# Patient Record
Sex: Male | Born: 1970 | Race: White | Hispanic: No | Marital: Married
Health system: Southern US, Community
[De-identification: ages and names within clinical notes are randomized; demographics above are authoritative.]

---

## 2021-12-01 ENCOUNTER — Other Ambulatory Visit (HOSPITAL_COMMUNITY): Payer: Self-pay

## 2021-12-01 ENCOUNTER — Inpatient Hospital Stay
Admission: RE | Admit: 2021-12-01 | Discharge: 2021-12-25 | Disposition: A | Discharge status: Rehabilitation Facility | Payer: No Typology Code available for payment source

## 2021-12-01 DIAGNOSIS — F10931 Alcohol use, unspecified with withdrawal delirium: Secondary | ICD-10-CM

## 2021-12-01 DIAGNOSIS — A419 Sepsis, unspecified organism: Secondary | ICD-10-CM

## 2021-12-01 DIAGNOSIS — N170 Acute kidney failure with tubular necrosis: Secondary | ICD-10-CM

## 2021-12-01 DIAGNOSIS — R6520 Severe sepsis without septic shock: Secondary | ICD-10-CM

## 2021-12-01 DIAGNOSIS — G061 Intraspinal abscess and granuloma: Secondary | ICD-10-CM

## 2021-12-01 DIAGNOSIS — J9621 Acute and chronic respiratory failure with hypoxia: Secondary | ICD-10-CM

## 2021-12-01 LAB — BLOOD GAS, ARTERIAL
Acid-Base Excess: 6.6 mmol/L — ABNORMAL HIGH (ref 0.0–2.0)
Bicarbonate: 31.3 mmol/L — ABNORMAL HIGH (ref 20.0–28.0)
O2 Saturation: 99.2 %
Patient temperature: 37
pCO2 arterial: 44 mmHg (ref 32–48)
pH, Arterial: 7.46 — ABNORMAL HIGH (ref 7.35–7.45)
pO2, Arterial: 132 mmHg — ABNORMAL HIGH (ref 83–108)

## 2021-12-01 IMAGING — DX DG ABDOMEN 1V
1 series · 1 of 1 positions shown · non-contrast
Comparison: [DATE]

CLINICAL DATA: Check PEG tube for leakage.

EXAM:
ABDOMEN - 1 VIEW

[abdomen]
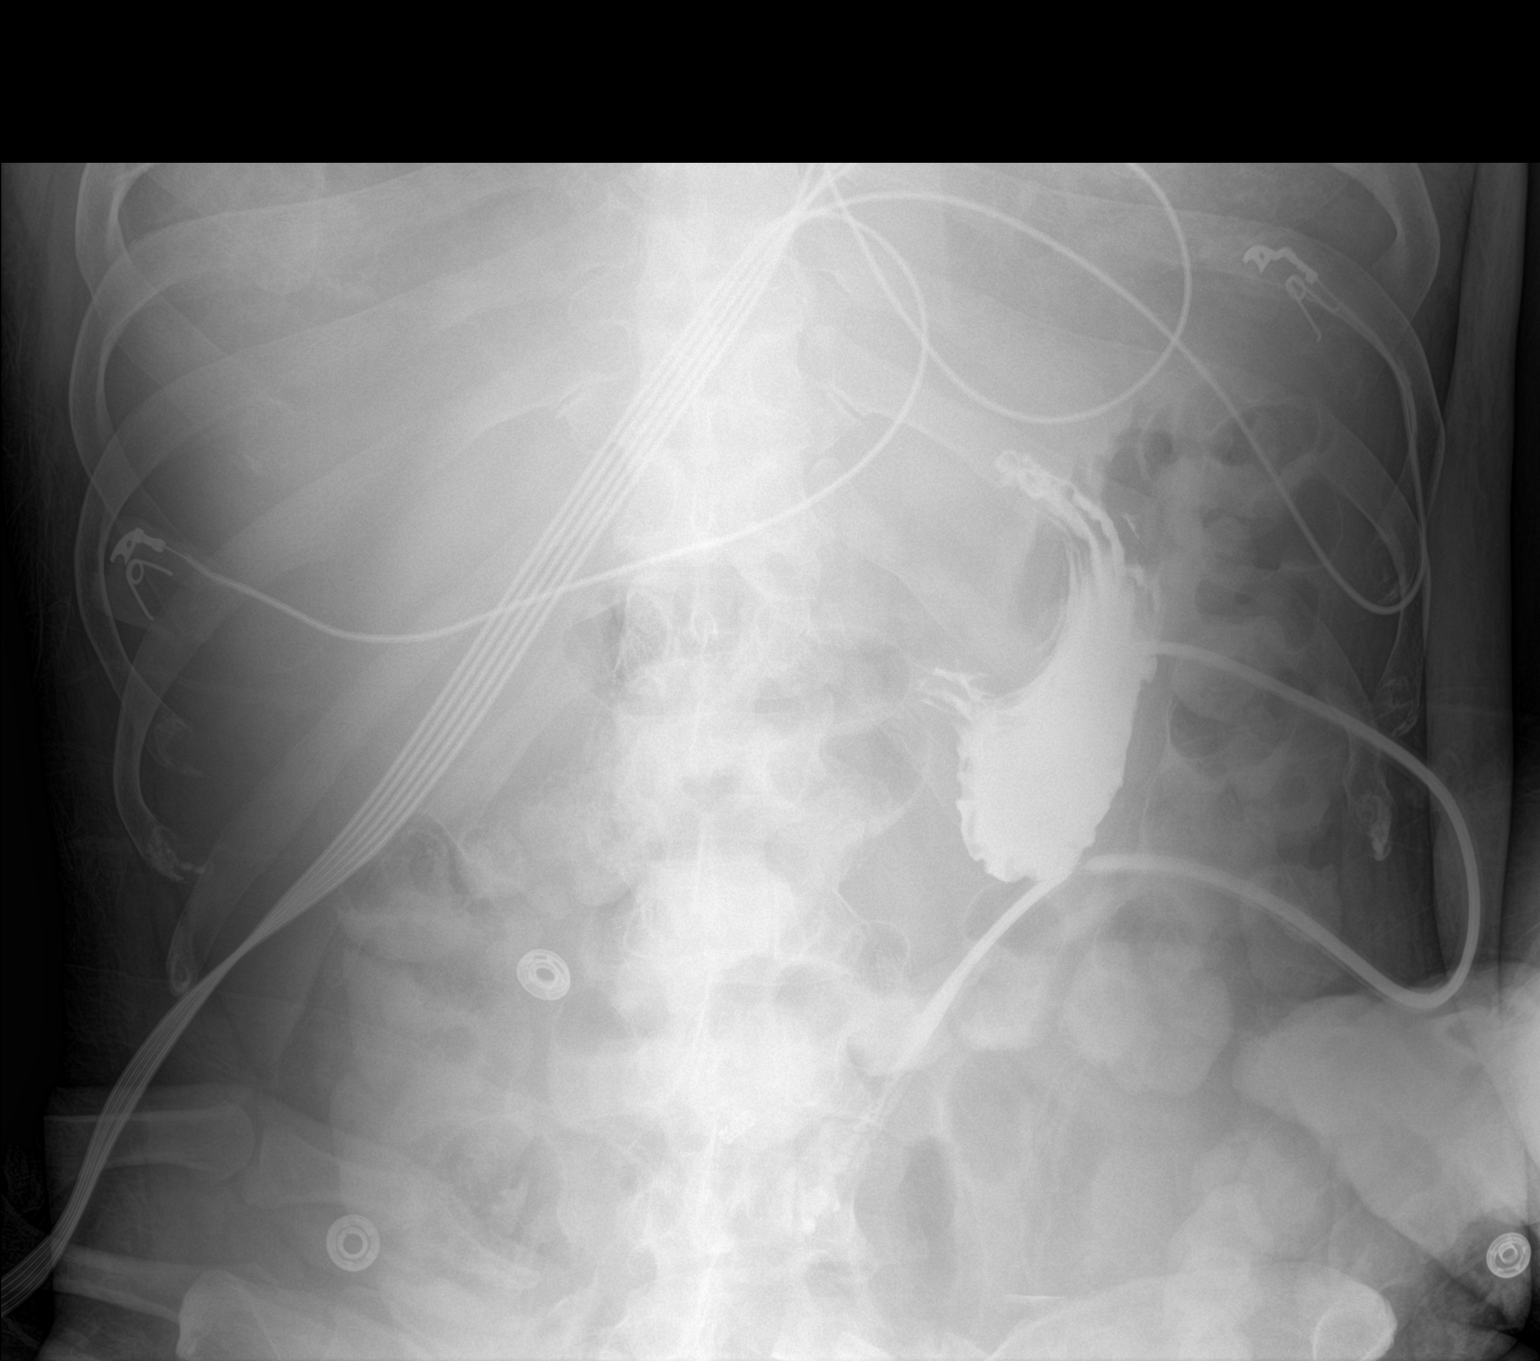

[1 of 1 positions shown; findings below may reference images not displayed]

FINDINGS: Contrast material has been instilled via the indwelling percutaneous
gastrostomy tube. Contrast material is demonstrated in the stomach.
No contrast extravasation. This suggest appropriate positioning of
the catheter without evidence of contrast leak.
IMPRESSION: Instilled contrast material is within the stomach without leakage
suggesting appropriate gastrostomy tube placement.

## 2021-12-01 IMAGING — DX DG CHEST 1V
1 series · 1 of 1 positions shown · non-contrast
Comparison: [DATE]

CLINICAL DATA: Respiratory failure.  Check PEG placement

EXAM:
CHEST  1 VIEW

[chest]
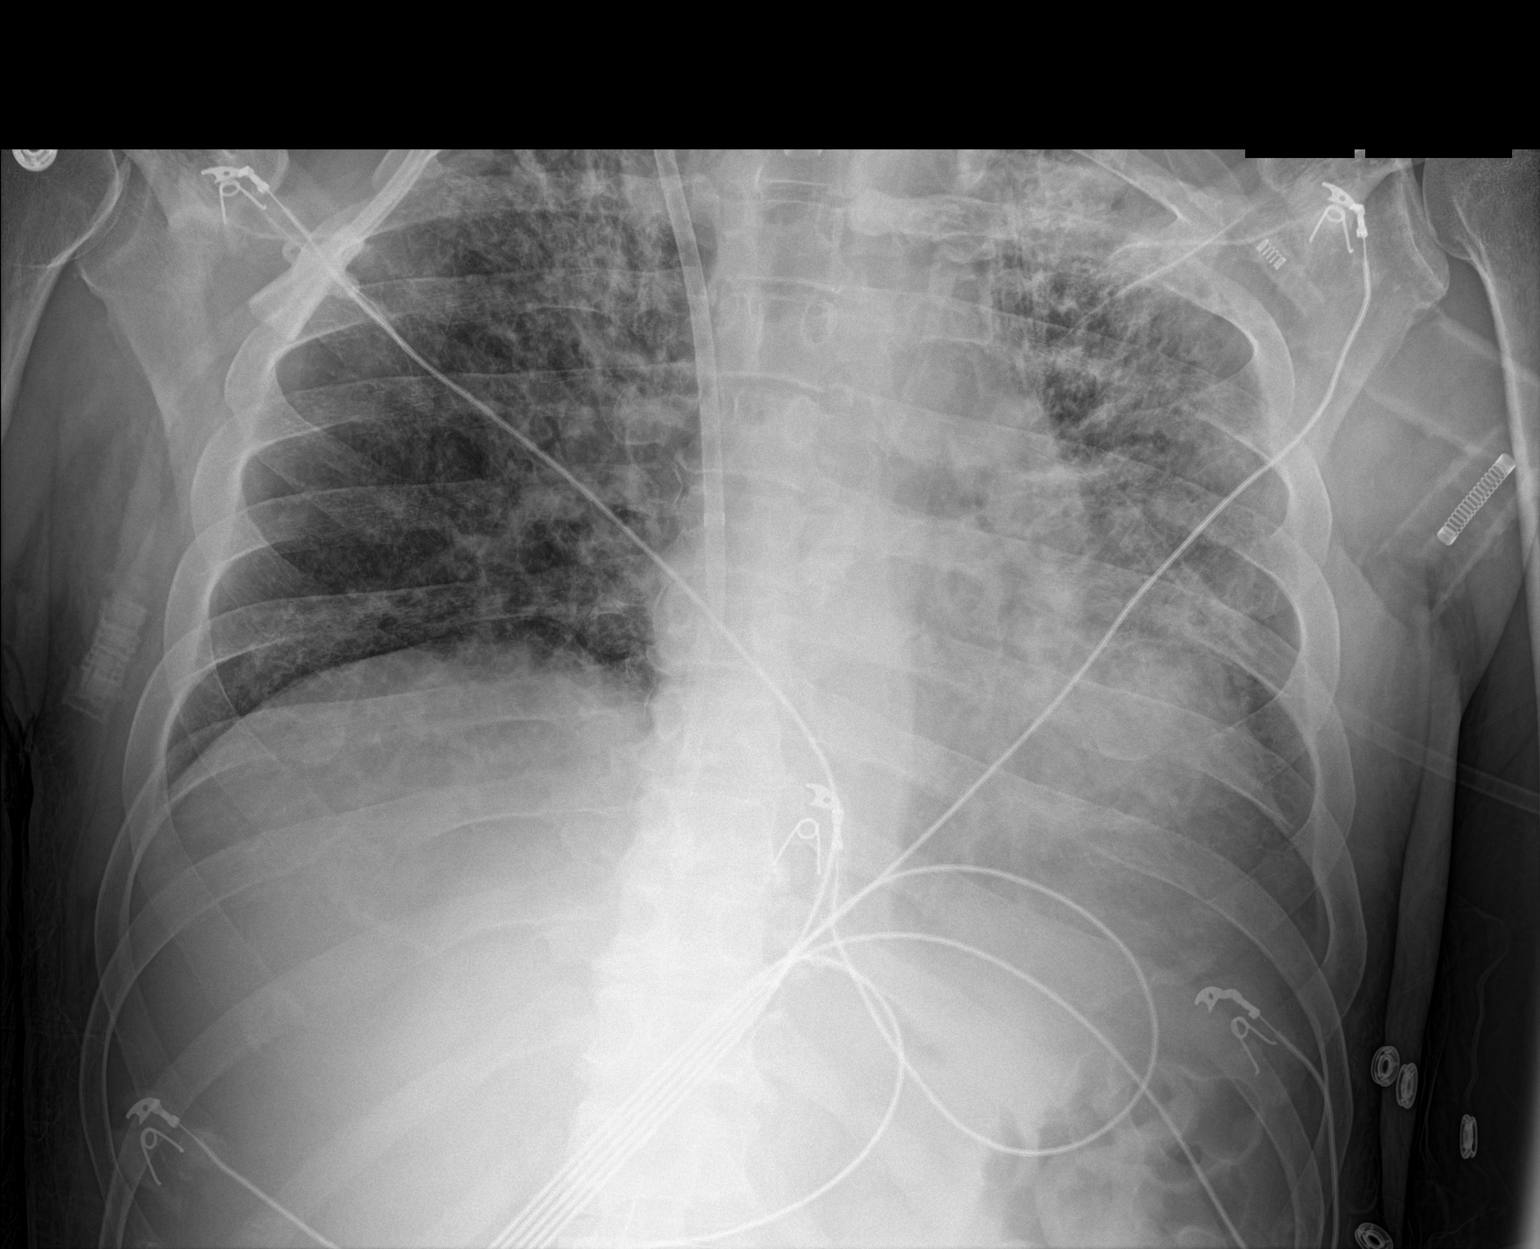

[1 of 1 positions shown; findings below may reference images not displayed]

FINDINGS: Tracheostomy tube and right central venous catheter remain in place
without change since prior study. Mild cardiac enlargement. Diffuse
bilateral pulmonary infiltrates are again demonstrated. No pleural
effusions. No pneumothorax.
IMPRESSION: Bilateral pulmonary infiltrates are unchanged. Appliances appear in
satisfactory position.

## 2021-12-01 MED ORDER — DIATRIZOATE MEGLUMINE & SODIUM 66-10 % PO SOLN
ORAL | Status: AC
  Filled 2021-12-01: qty 30

## 2021-12-01 MED ORDER — DIATRIZOATE MEGLUMINE & SODIUM 66-10 % PO SOLN
30.0000 mL | Freq: Once | ORAL | Status: AC
  Administered 2021-12-01: 30 mL

## 2021-12-02 ENCOUNTER — Other Ambulatory Visit (HOSPITAL_COMMUNITY): Payer: Self-pay

## 2021-12-02 ENCOUNTER — Other Ambulatory Visit (HOSPITAL_BASED_OUTPATIENT_CLINIC_OR_DEPARTMENT_OTHER): Payer: No Typology Code available for payment source

## 2021-12-02 DIAGNOSIS — F10931 Alcohol use, unspecified with withdrawal delirium: Secondary | ICD-10-CM

## 2021-12-02 DIAGNOSIS — I469 Cardiac arrest, cause unspecified: Secondary | ICD-10-CM

## 2021-12-02 DIAGNOSIS — J9621 Acute and chronic respiratory failure with hypoxia: Secondary | ICD-10-CM | POA: Diagnosis not present

## 2021-12-02 DIAGNOSIS — A419 Sepsis, unspecified organism: Secondary | ICD-10-CM

## 2021-12-02 DIAGNOSIS — G061 Intraspinal abscess and granuloma: Secondary | ICD-10-CM

## 2021-12-02 DIAGNOSIS — R6520 Severe sepsis without septic shock: Secondary | ICD-10-CM

## 2021-12-02 DIAGNOSIS — N170 Acute kidney failure with tubular necrosis: Secondary | ICD-10-CM

## 2021-12-02 LAB — CBC WITH DIFFERENTIAL/PLATELET
Abs Immature Granulocytes: 0.05 10*3/uL (ref 0.00–0.07)
Basophils Absolute: 0.1 10*3/uL (ref 0.0–0.1)
Basophils Relative: 0 %
Eosinophils Absolute: 0.8 10*3/uL — ABNORMAL HIGH (ref 0.0–0.5)
Eosinophils Relative: 7 %
HCT: 22 % — ABNORMAL LOW (ref 39.0–52.0)
Hemoglobin: 7.2 g/dL — ABNORMAL LOW (ref 13.0–17.0)
Immature Granulocytes: 0 %
Lymphocytes Relative: 8 %
Lymphs Abs: 0.9 10*3/uL (ref 0.7–4.0)
MCH: 30.5 pg (ref 26.0–34.0)
MCHC: 32.7 g/dL (ref 30.0–36.0)
MCV: 93.2 fL (ref 80.0–100.0)
Monocytes Absolute: 0.7 10*3/uL (ref 0.1–1.0)
Monocytes Relative: 6 %
Neutro Abs: 8.7 10*3/uL — ABNORMAL HIGH (ref 1.7–7.7)
Neutrophils Relative %: 79 %
Platelets: 449 10*3/uL — ABNORMAL HIGH (ref 150–400)
RBC: 2.36 MIL/uL — ABNORMAL LOW (ref 4.22–5.81)
RDW: 15 % (ref 11.5–15.5)
WBC: 11.2 10*3/uL — ABNORMAL HIGH (ref 4.0–10.5)
nRBC: 0 % (ref 0.0–0.2)

## 2021-12-02 LAB — APTT: aPTT: 45 seconds — ABNORMAL HIGH (ref 24–36)

## 2021-12-02 LAB — HEMOGLOBIN A1C
Hgb A1c MFr Bld: 5.2 % (ref 4.8–5.6)
Mean Plasma Glucose: 102.54 mg/dL

## 2021-12-02 LAB — BRAIN NATRIURETIC PEPTIDE: B Natriuretic Peptide: 239.5 pg/mL — ABNORMAL HIGH (ref 0.0–100.0)

## 2021-12-02 LAB — COMPREHENSIVE METABOLIC PANEL
ALT: 6 U/L (ref 0–44)
AST: 15 U/L (ref 15–41)
Albumin: 1.6 g/dL — ABNORMAL LOW (ref 3.5–5.0)
Alkaline Phosphatase: 305 U/L — ABNORMAL HIGH (ref 38–126)
Anion gap: 9 (ref 5–15)
BUN: 19 mg/dL (ref 6–20)
CO2: 27 mmol/L (ref 22–32)
Calcium: 8.3 mg/dL — ABNORMAL LOW (ref 8.9–10.3)
Chloride: 101 mmol/L (ref 98–111)
Creatinine, Ser: 0.85 mg/dL (ref 0.61–1.24)
GFR, Estimated: >60 mL/min (ref >60)
Glucose, Bld: 115 mg/dL — ABNORMAL HIGH (ref 70–99)
Potassium: 4.2 mmol/L (ref 3.5–5.1)
Sodium: 137 mmol/L (ref 135–145)
Total Bilirubin: 0.3 mg/dL (ref 0.3–1.2)
Total Protein: 6.8 g/dL (ref 6.5–8.1)

## 2021-12-02 LAB — MAGNESIUM: Magnesium: 1.9 mg/dL (ref 1.7–2.4)

## 2021-12-02 LAB — BLOOD GAS, ARTERIAL
Acid-Base Excess: 1.5 mmol/L (ref 0.0–2.0)
Acid-base deficit: 0.2 mmol/L (ref 0.0–2.0)
Bicarbonate: 27.7 mmol/L (ref 20.0–28.0)
Bicarbonate: 28.3 mmol/L — ABNORMAL HIGH (ref 20.0–28.0)
FIO2: 100 %
O2 Saturation: 97.1 %
O2 Saturation: 98.8 %
Patient temperature: 37.3
Patient temperature: 37
pCO2 arterial: 50 mmHg — ABNORMAL HIGH (ref 32–48)
pCO2 arterial: 63 mmHg — ABNORMAL HIGH (ref 32–48)
pH, Arterial: 7.36 (ref 7.35–7.45)
pH, Arterial: 7.26 — ABNORMAL LOW (ref 7.35–7.45)
pO2, Arterial: 89 mmHg (ref 83–108)
pO2, Arterial: 282 mmHg — ABNORMAL HIGH (ref 83–108)

## 2021-12-02 LAB — TROPONIN I (HIGH SENSITIVITY)
Troponin I (High Sensitivity): 13 ng/L (ref <18)
Troponin I (High Sensitivity): 18 ng/L — ABNORMAL HIGH (ref <18)
Troponin I (High Sensitivity): 10 ng/L (ref <18)

## 2021-12-02 LAB — ABO/RH: ABO/RH(D): A POS

## 2021-12-02 LAB — PROTIME-INR
INR: 1.7 — ABNORMAL HIGH (ref 0.8–1.2)
Prothrombin Time: 20 seconds — ABNORMAL HIGH (ref 11.4–15.2)

## 2021-12-02 LAB — CK: Total CK: 28 U/L — ABNORMAL LOW (ref 49–397)

## 2021-12-02 LAB — TSH: TSH: 7.015 u[IU]/mL — ABNORMAL HIGH (ref 0.350–4.500)

## 2021-12-02 LAB — ECHOCARDIOGRAM COMPLETE
Area-P 1/2: 4.83 cm2
Calc EF: 57.2 %
MV VTI: 4.27 cm2
S' Lateral: 3.3 cm
Single Plane A2C EF: 58.6 %
Single Plane A4C EF: 56.3 %

## 2021-12-02 LAB — LACTIC ACID, PLASMA: Lactic Acid, Venous: 0.5 mmol/L (ref 0.5–1.9)

## 2021-12-02 LAB — D-DIMER, QUANTITATIVE: D-Dimer, Quant: 4.9 ug/mL-FEU — ABNORMAL HIGH (ref 0.00–0.50)

## 2021-12-02 IMAGING — DX DG CHEST 1V PORT
1 series · 1 of 1 positions shown · non-contrast
Comparison: [DATE] and prior studies

CLINICAL DATA: Respiratory failure

EXAM:
PORTABLE CHEST 1 VIEW

[chest ap]
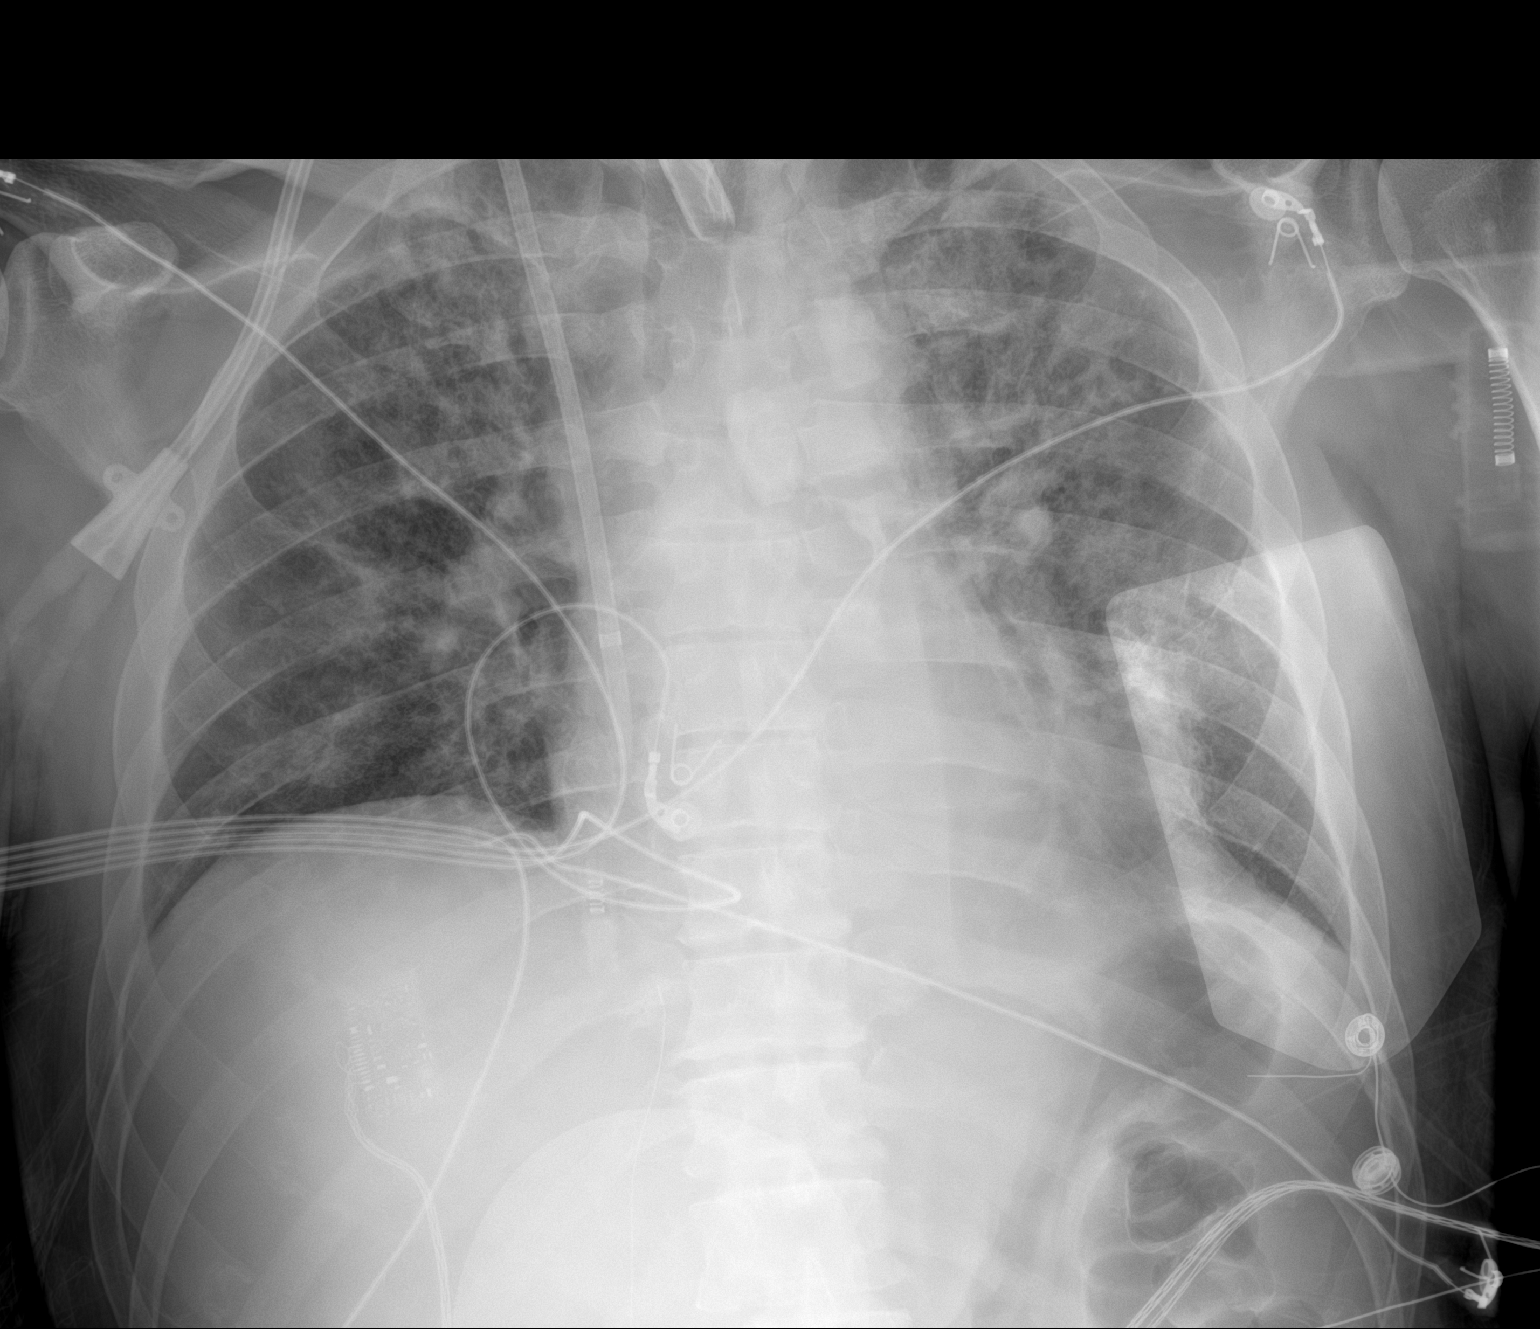

[1 of 1 positions shown; findings below may reference images not displayed]

FINDINGS: A tracheostomy tube and RIGHT IJ central venous catheter with tip
overlying the SUPERIOR cavoatrial junction again noted.

The cardiomediastinal silhouette is unremarkable.

Diffuse bilateral interstitial airspace opacities are again noted
and may be minimally increased.

There is no evidence of pneumothorax or large pleural effusion.

Defibrillator/pacing pads are noted.
IMPRESSION: Diffuse bilateral interstitial and airspace opacities, question
minimally increased.

## 2021-12-02 NOTE — Consult Note (Signed)
Pulmonary Critical Care Medicine ?[facility]  ?PULMONARY SERVICE ? ?Date of Service: 12/02/2021 ? ?PULMONARY CRITICAL CARE CONSULT ? ? ?Paul Mckenzie  ?KPT:[id number]  ?DOB: [birth year]  ? ?DOA: 12/01/2021 ? ?Referring Physician: Satira Sark, MD ? ?HPI: Paul Mckenzie is a 51 y.o. male seen for follow up of Acute on Chronic Respiratory Failure.  Patient has multiple medical problems including depression anxiety hypertension seizure disorder presented with altered mental status patient also has a history of heavy alcohol use 12 cans of white claw per day apparently.  Patient also has not not had any seizures for at least the last 9 years.  Apparently patient had fallen multiple times over the last several days the wife states that his mental status had declined progressively and had also been having some visualizations and hallucinations.  At time of initial admission patient was admitted with rhabdomyolysis hyponatremia acute renal failure and was started on appropriate care therapy.  Patient's oxygen requirements continued to have actually increased by 23 February and also patient had fevers along with pneumonia on the chest film.  Patient at that time was started on cefepime and MRI CT scan was done which showed an epidural abscess from C2-L1 along with this patient was noted to have moderate spinal canal stenosis and other signs consistent with meningitis.  Patient's renal function also deteriorated and was started on CRRT.  Patient remained paralyzed until the Nimbex was stopped on March 3 taper and to propofol was changed over to Versed.  Patient still was having issues with low blood pressure found to have a hemoglobin of 6.4 given blood at that point.  He continued to be dependent on the ventilator eventually transferred to our facility for further efforts at weaning.  When the patient came and apparently patient had a CODE BLUE event this morning which he was successfully  resuscitated but now is on the ventilator and full support on pressure control mode ? ?Review of Systems:  ?ROS performed and is unremarkable other than noted above. ? ?Past Medical History:  ?Diagnosis Date  ? Allergy  ? Anxiety  ? Depression  ? Hypertension  ? Seizures ([place])  ? ?History reviewed. No pertinent surgical history.  ?(Not in a hospital admission) ? ?Allergies  ?Allergen Reactions  ? Azithromycin Other (See Comments)  ?SEIZURE  ? ?Social History  ? ?Tobacco Use  ? Smoking status: Never  ? Smokeless tobacco: Current  ?Types: Snuff  ?Substance Use Topics  ? Alcohol use: Yes  ?Comment: 12 pack of white claw daily  ? ? ?Medications: ?Reviewed on Rounds ? ?Physical Exam: ? ?Vitals: Temperature 99.5 pulse 114 respiratory 20 blood pressure 162/80 saturations 95% ? ?Ventilator Settings pressure assist control FiO2 is 50% IP 16 PEEP 5 ? ?General: Comfortable at this time ?Eyes: Grossly normal lids, irises & conjunctiva ?ENT: grossly tongue is normal ?Neck: no obvious mass ?Cardiovascular: S1-S2 normal tacky ?Respiratory: Coarse rhonchi are noted bilaterally ?Abdomen: Soft and nontender ?Skin: no rash seen on limited exam ?Musculoskeletal: not rigid ?Psychiatric:unable to assess ?Neurologic: no seizure no involuntary movements   ?   ?   ?Labs on Admission:  ?Basic Metabolic Panel: ?Recent Labs  ?Lab 12/02/21 ?0218  ?NA 137  ?K 4.2  ?CL 101  ?CO2 27  ?GLUCOSE 115*  ?BUN 19  ?CREATININE 0.85  ?CALCIUM 8.3*  ?MG 1.9  ? ? ?Recent Labs  ?Lab 12/01/21 ?1943 12/02/21 ?0139 12/02/21 ?1700  ?PHART 7.46* 7.36 7.26*  ?PCO2ART 44 50* 63*  ?  PO2ART 132* 89 282*  ?HCO3 31.3* 27.7 28.3*  ?O2SAT 99.2 97.1 98.8  ? ? ?Liver Function Tests: ?Recent Labs  ?Lab 12/02/21 ?0218  ?AST 15  ?ALT 6  ?ALKPHOS 305*  ?BILITOT 0.3  ?PROT 6.8  ?ALBUMIN 1.6*  ? ?No results for input(s): LIPASE, AMYLASE in the last 168 hours. ?No results for input(s): AMMONIA in the last 168 hours. ? ?CBC: ?Recent Labs  ?Lab 12/02/21 ?0218  ?WBC 11.2*  ?NEUTROABS  8.7*  ?HGB 7.2*  ?HCT 22.0*  ?MCV 93.2  ?PLT 449*  ? ? ?Cardiac Enzymes: ?Recent Labs  ?Lab 12/02/21 ?0218  ?CKTOTAL 28*  ? ? ?BNP (last 3 results) ?Recent Labs  ?  12/02/21 ?0736  ?BNP 239.5*  ? ? ?ProBNP (last 3 results) ?No results for input(s): PROBNP in the last 8760 hours. ? ? ?Radiological Exams on Admission: ?DG Chest 1 View ? ?Result Date: 12/01/2021 ?CLINICAL DATA:  Respiratory failure.  Check PEG placement EXAM: CHEST  1 VIEW COMPARISON:  12/01/2021 FINDINGS: Tracheostomy tube and right central venous catheter remain in place without change since prior study. Mild cardiac enlargement. Diffuse bilateral pulmonary infiltrates are again demonstrated. No pleural effusions. No pneumothorax. IMPRESSION: Bilateral pulmonary infiltrates are unchanged. Appliances appear in satisfactory position. Electronically Signed   By: Lucienne Capers M.D.   On: 12/01/2021 23:04  ? ?DG ABDOMEN PEG TUBE LOCATION ? ?Result Date: 12/01/2021 ?CLINICAL DATA:  Check PEG tube for leakage. EXAM: ABDOMEN - 1 VIEW COMPARISON:  12/01/2021 FINDINGS: Contrast material has been instilled via the indwelling percutaneous gastrostomy tube. Contrast material is demonstrated in the stomach. No contrast extravasation. This suggest appropriate positioning of the catheter without evidence of contrast leak. IMPRESSION: Instilled contrast material is within the stomach without leakage suggesting appropriate gastrostomy tube placement. Electronically Signed   By: Lucienne Capers M.D.   On: 12/01/2021 23:06  ? ?DG Chest Port 1 View ? ?Result Date: 12/02/2021 ?CLINICAL DATA:  Respiratory failure EXAM: PORTABLE CHEST 1 VIEW COMPARISON:  12/01/2021 and prior studies FINDINGS: A tracheostomy tube and RIGHT IJ central venous catheter with tip overlying the SUPERIOR cavoatrial junction again noted. The cardiomediastinal silhouette is unremarkable. Diffuse bilateral interstitial airspace opacities are again noted and may be minimally increased. There is no  evidence of pneumothorax or large pleural effusion. Defibrillator/pacing pads are noted. IMPRESSION: Diffuse bilateral interstitial and airspace opacities, question minimally increased. Electronically Signed   By: Margarette Canada M.D.   On: 12/02/2021 09:04   ? ?Assessment/Plan ?Active Problems: ?  Acute on chronic respiratory failure with hypoxia (HCC) ?  Spinal epidural abscess ?  Alcohol withdrawal delirium ([place]) ?  Acute renal failure due to tubular necrosis (HCC) ?  Severe sepsis ([place]) ? ? ?Acute on chronic respiratory failure hypoxia at this time because of the CODE BLUE this morning I have recommended that we hold off on doing any weaning for now patient will be continued on pressure control we will also do a follow-up ABG.  Patient had been on 100% FiO2 with been able to wean him down to 50% FiO2 and the plan is going to be to continue to try to decrease the oxygen down based on the readings of the blood gas ?Epidural abscess apparently patient was treated with antibiotics we will need to assess whether there is any residual damage to the spinal cord since the location was up at the C2 level this could potentially have damaged innervation of the diaphragm also. ?Alcohol withdrawal no sign of active withdrawal right  now we will continue to monitor for any signs ?Acute renal failure the patient's most recent creatinine is 0.85 it has resolved however we will continue following patient's hydration status closely ?Severe sepsis apparently had staphylococcal bacteremia has been treated with antibiotics we will continue to monitor along closely. ? ?I have personally seen and evaluated the patient, evaluated laboratory and imaging results, formulated the assessment and plan and placed orders. ?The Patient requires high complexity decision making with multiple systems involvement.  ?Case was discussed on Rounds with the Respiratory Therapy Director and the Respiratory staff ?Time Spent 92mnutes ? ?SAllyne Gee MD  [facility] ?Pulmonary Critical Care Medicine ?Sleep Medicine ? ?

## 2021-12-02 NOTE — Progress Notes (Signed)
*  PRELIMINARY RESULTS* ?Echocardiogram ?2D Echocardiogram has been performed. ? ?Paul Mckenzie ?12/02/2021, 3:18 PM ?

## 2021-12-02 NOTE — Consult Note (Signed)
Referring Physician: S. Owens Shark, MD ? ?Paul Mckenzie is an 51 y.o. male.                       ?Chief Complaint: Cardiac arrest ? ?HPI: 51 years old white male with PMH of HTN, Seizure disorder, alcohol use disorder, s/p rhabdomyolysis, acute renal failure, pneumonia, meningitis, anemia had cardiac arrest this AM and successfully resuscitated. He is on ventilator with full support. His echocardiogram today and in past showed normal LV systolic function. His renal function has recovered. He has h/o vagal inhibition. ? ?Past Medical History:  ?Diagnosis Date  ? Allergy  ? Anxiety  ? Depression  ? Hypertension  ? Seizures ([place])  ? ?History reviewed. No pertinent surgical history.  ?(Not in a hospital admission) ? ?Allergies  ?Allergen Reactions  ? Azithromycin Other (See Comments)  ?SEIZURE  ? ?Social History  ? ?Tobacco Use  ? Smoking status: Never  ? Smokeless tobacco: Current  ?Types: Snuff  ?Substance Use Topics  ? Alcohol use: Yes  ?Comment: 12 pack of white claw daily  ? ? ?No family history on file. ? ?No medications prior to admission.  ?See list. ? ?Results for orders placed or performed during the hospital encounter of 12/01/21 (from the past 48 hour(s))  ?Blood gas, arterial     Status: Abnormal  ? Collection Time: 12/01/21  7:43 PM  ?Result Value Ref Range  ? pH, Arterial 7.46 (H) 7.35 - 7.45  ? pCO2 arterial 44 32 - 48 mmHg  ? pO2, Arterial 132 (H) 83 - 108 mmHg  ? Bicarbonate 31.3 (H) 20.0 - 28.0 mmol/L  ? Acid-Base Excess 6.6 (H) 0.0 - 2.0 mmol/L  ? O2 Saturation 99.2 %  ? Patient temperature 37.0   ? Collection site RIGHT RADIAL   ? Drawn by [facility].   ? Allens test (pass/fail) PASS PASS  ?  Comment: Performed at [facility], [place] [address] [place], [place] [address]  ?Blood gas, arterial     Status: Abnormal  ? Collection Time: 12/02/21  1:39 AM  ?Result Value Ref Range  ? pH, Arterial 7.36 7.35 - 7.45  ? pCO2 arterial 50 (H) 32 - 48 mmHg  ? pO2, Arterial 89 83 - 108 mmHg  ?  Bicarbonate 27.7 20.0 - 28.0 mmol/L  ? Acid-Base Excess 1.5 0.0 - 2.0 mmol/L  ? O2 Saturation 97.1 %  ? Patient temperature 37.3   ? Collection site RIGHT RADIAL   ? Drawn by  Zara Council   ? Allens test (pass/fail) PASS PASS  ?  Comment: Performed at [facility], [place] [address], [place], [place] [address]  ?Comprehensive metabolic panel     Status: Abnormal  ? Collection Time: 12/02/21  2:18 AM  ?Result Value Ref Range  ? Sodium 137 135 - 145 mmol/L  ? Potassium 4.2 3.5 - 5.1 mmol/L  ? Chloride 101 98 - 111 mmol/L  ? CO2 27 22 - 32 mmol/L  ? Glucose, Bld 115 (H) 70 - 99 mg/dL  ?  Comment: Glucose reference range applies only to samples taken after fasting for at least 8 hours.  ? BUN 19 6 - 20 mg/dL  ? Creatinine, Ser 0.85 0.61 - 1.24 mg/dL  ? Calcium 8.3 (L) 8.9 - 10.3 mg/dL  ? Total Protein 6.8 6.5 - 8.1 g/dL  ? Albumin 1.6 (L) 3.5 - 5.0 g/dL  ? AST 15 15 - 41 U/L  ? ALT  6 0 - 44 U/L  ? Alkaline Phosphatase 305 (H) 38 - 126 U/L  ? Total Bilirubin 0.3 0.3 - 1.2 mg/dL  ? GFR, Estimated >60 >60 mL/min  ?  Comment: (NOTE) ?Calculated using the CKD-EPI Creatinine Equation (2021) ?  ? Anion gap 9 5 - 15  ?  Comment: Performed at [facility], [place] [address], [place], [place] [address]  ?CBC with Differential/Platelet     Status: Abnormal  ? Collection Time: 12/02/21  2:18 AM  ?Result Value Ref Range  ? WBC 11.2 (H) 4.0 - 10.5 K/uL  ? RBC 2.36 (L) 4.22 - 5.81 MIL/uL  ? Hemoglobin 7.2 (L) 13.0 - 17.0 g/dL  ? HCT 22.0 (L) 39.0 - 52.0 %  ? MCV 93.2 80.0 - 100.0 fL  ? MCH 30.5 26.0 - 34.0 pg  ? MCHC 32.7 30.0 - 36.0 g/dL  ? RDW 15.0 11.5 - 15.5 %  ? Platelets 449 (H) 150 - 400 K/uL  ? nRBC 0.0 0.0 - 0.2 %  ? Neutrophils Relative % 79 %  ? Neutro Abs 8.7 (H) 1.7 - 7.7 K/uL  ? Lymphocytes Relative 8 %  ? Lymphs Abs 0.9 0.7 - 4.0 K/uL  ? Monocytes Relative 6 %  ? Monocytes Absolute 0.7 0.1 - 1.0 K/uL  ? Eosinophils Relative 7 %  ? Eosinophils Absolute 0.8 (H) 0.0 - 0.5 K/uL  ? Basophils Relative 0 %  ?  Basophils Absolute 0.1 0.0 - 0.1 K/uL  ? Immature Granulocytes 0 %  ? Abs Immature Granulocytes 0.05 0.00 - 0.07 K/uL  ?  Comment: Performed at [facility], [place] [address] [place], [place] [address]  ?Hemoglobin A1c     Status: None  ? Collection Time: 12/02/21  2:18 AM  ?Result Value Ref Range  ? Hgb A1c MFr Bld 5.2 4.8 - 5.6 %  ?  Comment: (NOTE) ?Pre diabetes:          5.7%-6.4% ? ?Diabetes:              >6.4% ? ?Glycemic control for   <7.0% ?adults with diabetes ?  ? Mean Plasma Glucose 102.54 mg/dL  ?  Comment: Performed at [facility], [place] [address] [place], [place] [address]  ?TSH     Status: Abnormal  ? Collection Time: 12/02/21  2:18 AM  ?Result Value Ref Range  ? TSH 7.015 (H) 0.350 - 4.500 uIU/mL  ?  Comment: Performed by a 3rd Generation assay with a functional sensitivity of <=0.01 uIU/mL. ?Performed at [facility], [place] [address], [place], [place] [address] ?  ?Magnesium     Status: None  ? Collection Time: 12/02/21  2:18 AM  ?Result Value Ref Range  ? Magnesium 1.9 1.7 - 2.4 mg/dL  ?  Comment: Performed at [facility], [place] [address] [place], [place] [address]  ?ABO/Rh     Status: None  ? Collection Time: 12/02/21  2:18 AM  ?Result Value Ref Range  ? ABO/RH(D)    ?  A POS ?Performed at [facility], [place] [address] [place], [place] [address] ?  ?CK     Status: Abnormal  ? Collection Time: 12/02/21  2:18 AM  ?Result Value Ref Range  ? Total CK 28 (L) 49 - 397 U/L  ?  Comment: Performed at [facility], [place] [address] [place], [place] [address]  ?Blood gas, arterial     Status: Abnormal  ?  Collection Time: 12/02/21  6:55 AM  ?Result Value Ref Range  ? FIO2 100.00 %  ? pH, Arterial 7.26 (L) 7.35 - 7.45  ? pCO2 arterial 63 (H) 32 - 48 mmHg  ? pO2, Arterial 282 (H) 83 - 108 mmHg  ? Bicarbonate 28.3 (H) 20.0 - 28.0 mmol/L  ? Acid-base deficit 0.2 0.0 - 2.0 mmol/L  ? O2 Saturation 98.8 %  ? Patient temperature 37.0   ? Collection site RIGHT RADIAL   ? Drawn by  Gooding RN   ?  Comment: CARLOS HERNANDEZ  ? Allens test (pass/fail) PASS PASS  ?  Comment: Performed at [facility], [place] [address] [place], [place] [address]  ?Type and screen [facility]     Status: None  ? Collection Time: 12/02/21  7:36 AM  ?Result Value Ref Range  ? ABO/RH(D) A POS   ? Antibody Screen NEG   ? Sample Expiration    ?  12/05/2021,2359 ?Performed at [facility], [place] [address], [place], [place] [address] ?  ?Protime-INR     Status: Abnormal  ? Collection Time: 12/02/21  7:36 AM  ?Result Value Ref Range  ? Prothrombin Time 20.0 (H) 11.4 - 15.2 seconds  ? INR 1.7 (H) 0.8 - 1.2  ?  Comment: (NOTE) ?INR goal varies based on device and disease states. ?Performed at [facility], [place] [address] [place], [place] ?[address] ?  ?APTT     Status: Abnormal  ? Collection Time: 12/02/21  7:36 AM  ?Result Value Ref Range  ? aPTT 45 (H) 24 - 36 seconds  ?  Comment:        ?IF BASELINE aPTT IS ELEVATED, ?SUGGEST PATIENT RISK ASSESSMENT ?BE USED TO DETERMINE APPROPRIATE ?ANTICOAGULANT THERAPY. ?Performed at [facility], [place] [address], [place], [place] [address] ?  ?D-dimer, quantitative     Status: Abnormal  ? Collection Time: 12/02/21  7:36 AM  ?Result Value Ref Range  ? D-Dimer, Quant 4.90 (H) 0.00 - 0.50 ug/mL-FEU  ?  Comment: (NOTE) ?At the manufacturer cut-off value of 0.5 ?g/mL FEU, this assay has a ?negative predictive value of 95-100%.This assay is intended for use ?in conjunction with a clinical pretest probability (PTP) assessment ?model to exclude pulmonary embolism (PE) and deep venous thrombosis ?(DVT) in outpatients suspected of PE or DVT. ?Results should be correlated with clinical presentation. ?Performed at [facility], [place] [address] [place], [place] ?[address] ?  ?Troponin I (High Sensitivity)     Status: None  ? Collection Time: 12/02/21  7:36 AM  ?Result Value Ref Range  ? Troponin I (High Sensitivity) 13 <18 ng/L  ?  Comment:  (NOTE) ?Elevated high sensitivity troponin I (hsTnI) values and significant  ?changes across serial measurements may suggest ACS but many other  ?chronic and acute conditions are known to elevate hsTnI results.  ?Refer to the "Links"

## 2021-12-03 ENCOUNTER — Other Ambulatory Visit (HOSPITAL_COMMUNITY): Payer: Self-pay

## 2021-12-03 DIAGNOSIS — N170 Acute kidney failure with tubular necrosis: Secondary | ICD-10-CM | POA: Diagnosis not present

## 2021-12-03 DIAGNOSIS — F10931 Alcohol use, unspecified with withdrawal delirium: Secondary | ICD-10-CM | POA: Diagnosis not present

## 2021-12-03 DIAGNOSIS — A419 Sepsis, unspecified organism: Secondary | ICD-10-CM | POA: Diagnosis not present

## 2021-12-03 DIAGNOSIS — J9621 Acute and chronic respiratory failure with hypoxia: Secondary | ICD-10-CM | POA: Diagnosis not present

## 2021-12-03 LAB — PREPARE RBC (CROSSMATCH)

## 2021-12-03 LAB — COMPREHENSIVE METABOLIC PANEL
ALT: 6 U/L (ref 0–44)
AST: 14 U/L — ABNORMAL LOW (ref 15–41)
Albumin: 1.5 g/dL — ABNORMAL LOW (ref 3.5–5.0)
Alkaline Phosphatase: 243 U/L — ABNORMAL HIGH (ref 38–126)
Anion gap: 12 (ref 5–15)
BUN: 16 mg/dL (ref 6–20)
CO2: 27 mmol/L (ref 22–32)
Calcium: 8.4 mg/dL — ABNORMAL LOW (ref 8.9–10.3)
Chloride: 103 mmol/L (ref 98–111)
Creatinine, Ser: 0.78 mg/dL (ref 0.61–1.24)
GFR, Estimated: >60 mL/min (ref >60)
Glucose, Bld: 113 mg/dL — ABNORMAL HIGH (ref 70–99)
Potassium: 3.7 mmol/L (ref 3.5–5.1)
Sodium: 142 mmol/L (ref 135–145)
Total Bilirubin: 0.3 mg/dL (ref 0.3–1.2)
Total Protein: 6.5 g/dL (ref 6.5–8.1)

## 2021-12-03 LAB — CBC
HCT: 20.3 % — ABNORMAL LOW (ref 39.0–52.0)
HCT: 22.4 % — ABNORMAL LOW (ref 39.0–52.0)
Hemoglobin: 6.4 g/dL — CL (ref 13.0–17.0)
Hemoglobin: 7.3 g/dL — ABNORMAL LOW (ref 13.0–17.0)
MCH: 29.8 pg (ref 26.0–34.0)
MCH: 30 pg (ref 26.0–34.0)
MCHC: 31.5 g/dL (ref 30.0–36.0)
MCHC: 32.6 g/dL (ref 30.0–36.0)
MCV: 94.4 fL (ref 80.0–100.0)
MCV: 92.2 fL (ref 80.0–100.0)
Platelets: 450 10*3/uL — ABNORMAL HIGH (ref 150–400)
Platelets: 422 10*3/uL — ABNORMAL HIGH (ref 150–400)
RBC: 2.15 MIL/uL — ABNORMAL LOW (ref 4.22–5.81)
RBC: 2.43 MIL/uL — ABNORMAL LOW (ref 4.22–5.81)
RDW: 15.3 % (ref 11.5–15.5)
RDW: 15.3 % (ref 11.5–15.5)
WBC: 8.1 10*3/uL (ref 4.0–10.5)
WBC: 8.9 10*3/uL (ref 4.0–10.5)
nRBC: 0 % (ref 0.0–0.2)
nRBC: 0 % (ref 0.0–0.2)

## 2021-12-03 LAB — URINALYSIS, ROUTINE W REFLEX MICROSCOPIC
Bilirubin Urine: NEGATIVE
Glucose, UA: NEGATIVE mg/dL
Ketones, ur: NEGATIVE mg/dL
Nitrite: NEGATIVE
Protein, ur: 30 mg/dL — AB
Specific Gravity, Urine: 1.017 (ref 1.005–1.030)
pH: 5 (ref 5.0–8.0)

## 2021-12-03 LAB — T4, FREE: Free T4: 1.14 ng/dL — ABNORMAL HIGH (ref 0.61–1.12)

## 2021-12-03 LAB — MAGNESIUM
Magnesium: 1.4 mg/dL — ABNORMAL LOW (ref 1.7–2.4)
Magnesium: 1.6 mg/dL — ABNORMAL LOW (ref 1.7–2.4)

## 2021-12-03 LAB — BLOOD GAS, ARTERIAL
Acid-Base Excess: 8 mmol/L — ABNORMAL HIGH (ref 0.0–2.0)
Bicarbonate: 32 mmol/L — ABNORMAL HIGH (ref 20.0–28.0)
Drawn by: 164
O2 Saturation: 98.4 %
Patient temperature: 38.1
pCO2 arterial: 43 mmHg (ref 32–48)
pH, Arterial: 7.48 — ABNORMAL HIGH (ref 7.35–7.45)
pO2, Arterial: 148 mmHg — ABNORMAL HIGH (ref 83–108)

## 2021-12-03 LAB — AMMONIA: Ammonia: 35 umol/L (ref 9–35)

## 2021-12-03 IMAGING — DX DG CHEST 1V PORT
1 series · 1 of 1 positions shown · non-contrast
Comparison: [DATE]

CLINICAL DATA: Pulmonary infiltrates.

EXAM:
PORTABLE CHEST 1 VIEW

[chest ap]
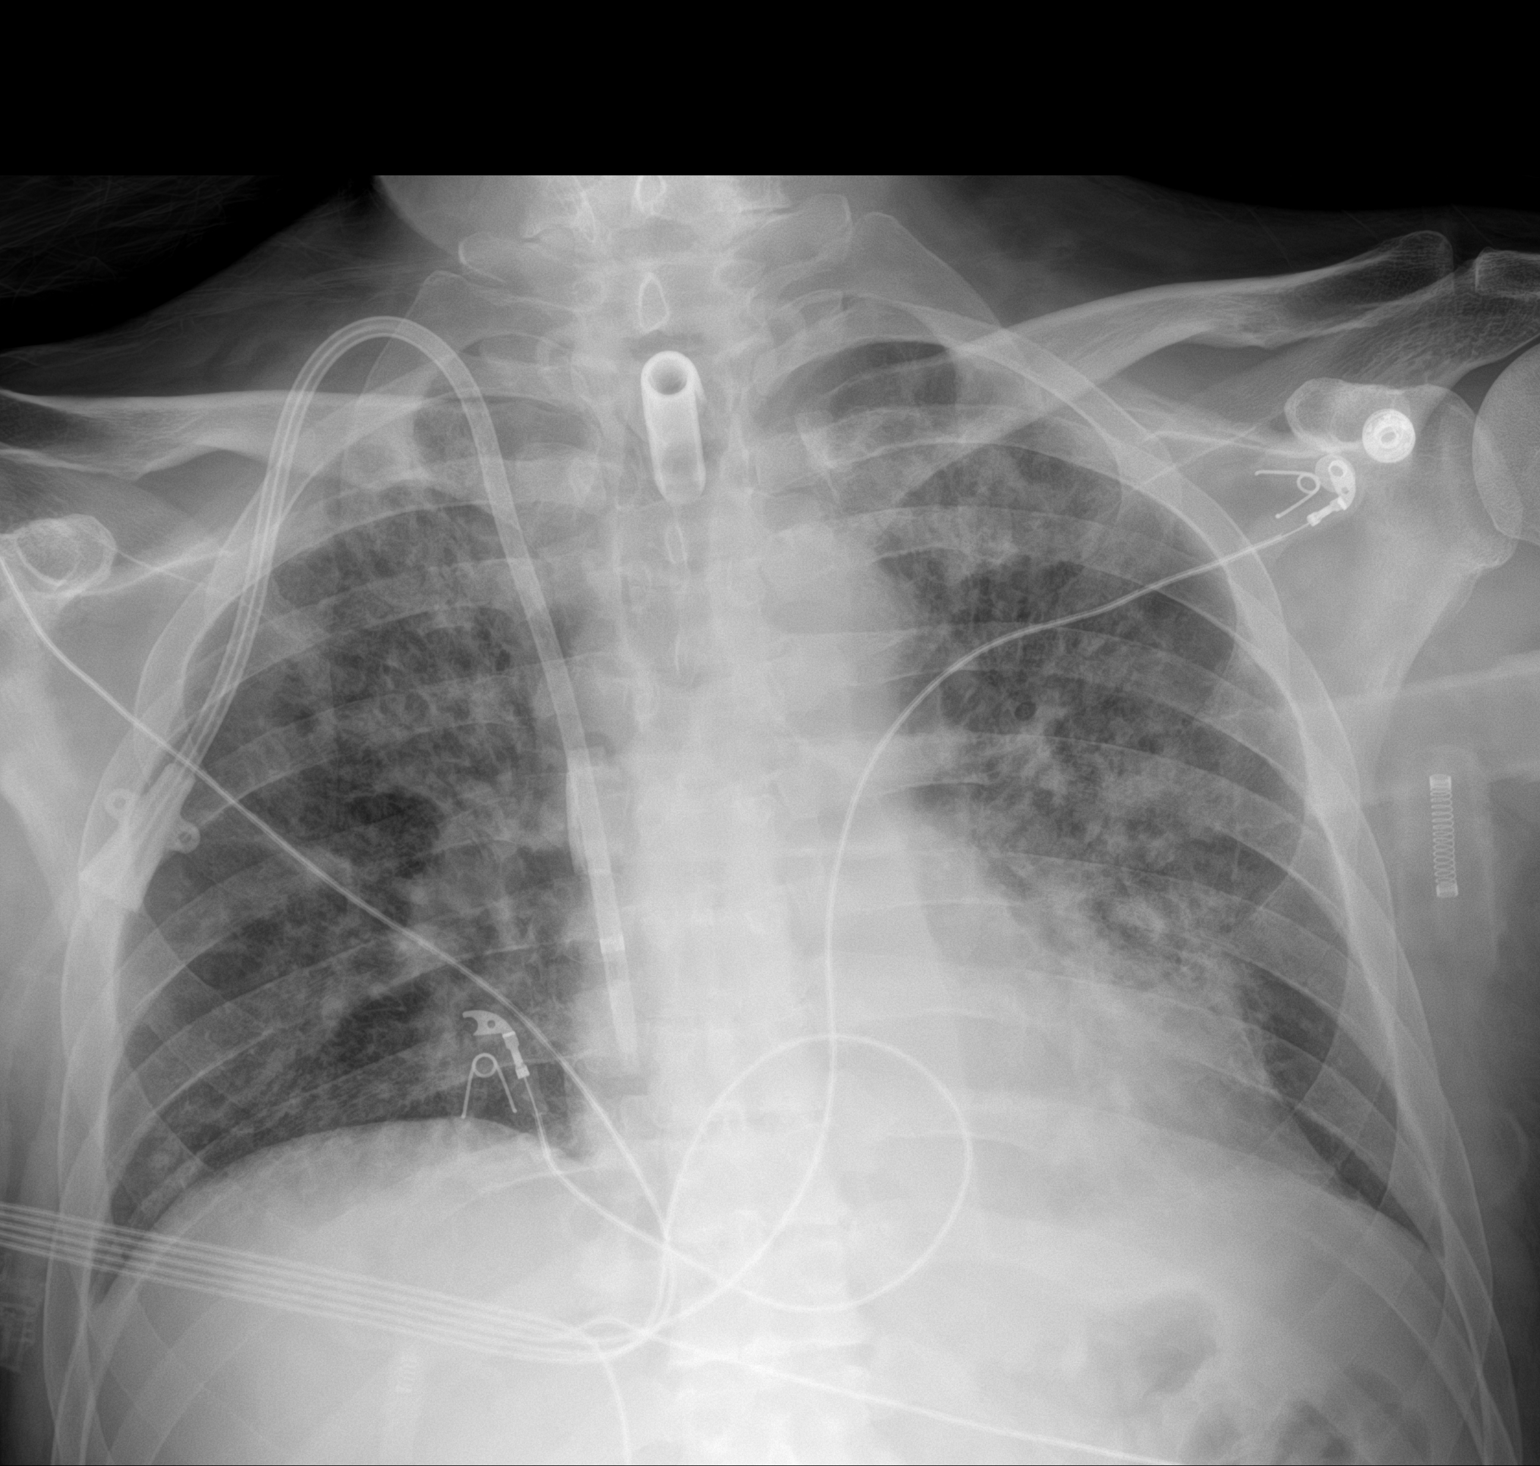

[1 of 1 positions shown; findings below may reference images not displayed]

FINDINGS: [PS] hours. [facility] cardiomegaly bilateral diffuse airspace disease is
similar to prior. Tracheostomy tube and right IJ central line again
noted. Telemetry leads overlie the chest.
IMPRESSION: No substantial interval change in exam. Diffuse bilateral airspace
disease.

## 2021-12-03 NOTE — Progress Notes (Signed)
Pulmonary Critical Care Medicine ?[place] ?  ?PULMONARY CRITICAL CARE SERVICE ? ?PROGRESS NOTE ? ? ? ? ?Paul Mckenzie  ?BDZ:[id number]  ?DOB: 12-Oct-1970  ? ?DOA: 12/01/2021 ? ?Referring Physician: Satira Sark, MD ? ?HPI: Paul Mckenzie is a 51 y.o. male being followed for ventilator/airway/oxygen weaning Acute on Chronic Respiratory Failure.  Patient currently is on full support on the ventilator on pressure assist control mode has been on 40% FiO2 wean is being held because of issues with bradycardia yesterday ? ?Medications: ?Reviewed on Rounds ? ?Physical Exam: ? ?Vitals: Temperature 97.6 pulse 101 respiratory rate is 20 blood pressure is 152/87 saturations 100% ? ?Ventilator Settings pressure assist control FiO2 40% IP 16 PEEP 5 ? ?General: Comfortable at this time ?Neck: supple ?Cardiovascular: no malignant arrhythmias ?Respiratory: Coarse rhonchi expansion is equal ?Skin: no rash seen on limited exam ?Musculoskeletal: No gross abnormality ?Psychiatric:unable to assess ?Neurologic:no involuntary movements   ?   ?   ?Lab Data:  ? ?Basic Metabolic Panel: ?Recent Labs  ?Lab 12/02/21 ?0218 12/03/21 ?3419  ?NA 137 142  ?K 4.2 3.7  ?CL 101 103  ?CO2 27 27  ?GLUCOSE 115* 113*  ?BUN 19 16  ?CREATININE 0.85 0.78  ?CALCIUM 8.3* 8.4*  ?MG 1.9 1.4*  ? ? ?ABG: ?Recent Labs  ?Lab 12/01/21 ?1943 12/02/21 ?0139 12/02/21 ?6222  ?PHART 7.46* 7.36 7.26*  ?PCO2ART 44 50* 63*  ?PO2ART 132* 89 282*  ?HCO3 31.3* 27.7 28.3*  ?O2SAT 99.2 97.1 98.8  ? ? ?Liver Function Tests: ?Recent Labs  ?Lab 12/02/21 ?0218 12/03/21 ?9798  ?AST 15 14*  ?ALT 6 6  ?ALKPHOS 305* 243*  ?BILITOT 0.3 0.3  ?PROT 6.8 6.5  ?ALBUMIN 1.6* 1.5*  ? ?No results for input(s): LIPASE, AMYLASE in the last 168 hours. ?No results for input(s): AMMONIA in the last 168 hours. ? ?CBC: ?Recent Labs  ?Lab 12/02/21 ?0218 12/03/21 ?9211  ?WBC 11.2* 8.1  ?NEUTROABS 8.7*  --   ?HGB 7.2* 6.4*  ?HCT 22.0* 20.3*  ?MCV 93.2 94.4  ?PLT 449*  450*  ? ? ?Cardiac Enzymes: ?Recent Labs  ?Lab 12/02/21 ?0218  ?CKTOTAL 28*  ? ? ?BNP (last 3 results) ?Recent Labs  ?  12/02/21 ?0736  ?BNP 239.5*  ? ? ?ProBNP (last 3 results) ?No results for input(s): PROBNP in the last 8760 hours. ? ?Radiological Exams: ?DG Chest 1 View ? ?Result Date: 12/01/2021 ?CLINICAL DATA:  Respiratory failure.  Check PEG placement EXAM: CHEST  1 VIEW COMPARISON:  12/01/2021 FINDINGS: Tracheostomy tube and right central venous catheter remain in place without change since prior study. Mild cardiac enlargement. Diffuse bilateral pulmonary infiltrates are again demonstrated. No pleural effusions. No pneumothorax. IMPRESSION: Bilateral pulmonary infiltrates are unchanged. Appliances appear in satisfactory position. Electronically Signed   By: Lucienne Capers M.D.   On: 12/01/2021 23:04  ? ?DG ABDOMEN PEG TUBE LOCATION ? ?Result Date: 12/01/2021 ?CLINICAL DATA:  Check PEG tube for leakage. EXAM: ABDOMEN - 1 VIEW COMPARISON:  12/01/2021 FINDINGS: Contrast material has been instilled via the indwelling percutaneous gastrostomy tube. Contrast material is demonstrated in the stomach. No contrast extravasation. This suggest appropriate positioning of the catheter without evidence of contrast leak. IMPRESSION: Instilled contrast material is within the stomach without leakage suggesting appropriate gastrostomy tube placement. Electronically Signed   By: Lucienne Capers M.D.   On: 12/01/2021 23:06  ? ?DG Chest Port 1 View ? ?Result Date: 12/02/2021 ?CLINICAL DATA:  Respiratory failure EXAM: PORTABLE CHEST 1 VIEW COMPARISON:  12/01/2021 and prior studies FINDINGS: A tracheostomy tube and RIGHT IJ central venous catheter with tip overlying the SUPERIOR cavoatrial junction again noted. The cardiomediastinal silhouette is unremarkable. Diffuse bilateral interstitial airspace opacities are again noted and may be minimally increased. There is no evidence of pneumothorax or large pleural effusion.  Defibrillator/pacing pads are noted. IMPRESSION: Diffuse bilateral interstitial and airspace opacities, question minimally increased. Electronically Signed   By: Margarette Canada M.D.   On: 12/02/2021 09:04  ? ?ECHOCARDIOGRAM COMPLETE ? ?Result Date: 12/02/2021 ?   ECHOCARDIOGRAM REPORT   Patient Name:   Paul Mckenzie Date of Exam: 12/02/2021 Medical Rec #:  [id number]              Height: Accession #:    [id number]             Weight: Date of Birth:  August 08, 1971              BSA: Patient Age:    60 years               BP:           0/0 mmHg Patient Gender: M                      HR:           101 bpm. Exam Location:  Inpatient Procedure: 2D Echo, Cardiac Doppler and Color Doppler Indications:    Cardiac arrest  History:        Patient has no prior history of Echocardiogram examinations.                 Acute MI.  Sonographer:    Wenda Low Referring Phys: [id number] Jacobo Forest  Sonographer Comments: Dr. Doylene Canard to read IMPRESSIONS  1. Left ventricular ejection fraction, by estimation, is 60 to 65%. The left ventricle has normal function. The left ventricle has no regional wall motion abnormalities. Left ventricular diastolic parameters were normal.  2. Right ventricular systolic function is normal. The right ventricular size is normal. Tricuspid regurgitation signal is inadequate for assessing PA pressure.  3. The mitral valve is normal in structure. Trivial mitral valve regurgitation. No evidence of mitral stenosis.  4. The aortic valve is normal in structure. Aortic valve regurgitation is not visualized. No aortic stenosis is present.  5. Aortic dilatation noted. There is borderline dilatation of the aortic root, measuring 38 mm.  6. The inferior vena cava is normal in size with greater than 50% respiratory variability, suggesting right atrial pressure of 3 mmHg. Comparison(s): No prior Echocardiogram. FINDINGS  Left Ventricle: Left ventricular ejection fraction, by estimation, is 60 to 65%. The left ventricle  has normal function. The left ventricle has no regional wall motion abnormalities. The left ventricular internal cavity size was normal in size. There is  no left ventricular hypertrophy. Left ventricular diastolic parameters were normal. Right Ventricle: The right ventricular size is normal. Right ventricular systolic function is normal. Tricuspid regurgitation signal is inadequate for assessing PA pressure. The tricuspid regurgitant velocity is 2.14 m/s, and with an assumed right atrial  pressure of 8 mmHg, the estimated right ventricular systolic pressure is 42.6 mmHg. Left Atrium: Left atrial size was normal in size. Right Atrium: Right atrial size was normal in size. Pericardium: There is no evidence of pericardial effusion. Mitral Valve: The mitral valve is normal in structure. Trivial mitral valve regurgitation. No evidence of mitral valve stenosis. MV peak gradient, 5.5 mmHg. The mean mitral  valve gradient is 3.0 mmHg. Tricuspid Valve: The tricuspid valve is normal in structure. Tricuspid valve regurgitation is trivial. No evidence of tricuspid stenosis. Aortic Valve: The aortic valve is normal in structure. Aortic valve regurgitation is not visualized. No aortic stenosis is present. Pulmonic Valve: The pulmonic valve was normal in structure. Pulmonic valve regurgitation is not visualized. No evidence of pulmonic stenosis. Aorta: Aortic dilatation noted. There is borderline dilatation of the aortic root, measuring 38 mm. Venous: The inferior vena cava is normal in size with greater than 50% respiratory variability, suggesting right atrial pressure of 3 mmHg. IAS/Shunts: No atrial level shunt detected by color flow Doppler.  LEFT VENTRICLE PLAX 2D LVIDd:         5.00 cm     Diastology LVIDs:         3.30 cm     LV e' medial:    10.20 cm/s LV PW:         1.10 cm     LV E/e' medial:  10.9 LV IVS:        1.10 cm     LV e' lateral:   16.30 cm/s LVOT diam:     2.20 cm     LV E/e' lateral: 6.8 LV SV:         101  LVOT Area:     3.80 cm?  LV Volumes (MOD) LV vol d, MOD A2C: 78.2 ml LV vol d, MOD A4C: 92.0 ml LV vol s, MOD A2C: 32.4 ml LV vol s, MOD A4C: 40.2 ml LV SV MOD A2C:     45.8 ml LV SV MOD A4C:     92.0 ml LV SV MOD BP

## 2021-12-04 ENCOUNTER — Other Ambulatory Visit (HOSPITAL_COMMUNITY): Payer: Self-pay

## 2021-12-04 DIAGNOSIS — J9621 Acute and chronic respiratory failure with hypoxia: Secondary | ICD-10-CM | POA: Diagnosis not present

## 2021-12-04 DIAGNOSIS — F10931 Alcohol use, unspecified with withdrawal delirium: Secondary | ICD-10-CM | POA: Diagnosis not present

## 2021-12-04 DIAGNOSIS — A419 Sepsis, unspecified organism: Secondary | ICD-10-CM | POA: Diagnosis not present

## 2021-12-04 DIAGNOSIS — N170 Acute kidney failure with tubular necrosis: Secondary | ICD-10-CM | POA: Diagnosis not present

## 2021-12-04 LAB — URINE CULTURE: Culture: NO GROWTH

## 2021-12-04 LAB — CBC
HCT: 25.4 % — ABNORMAL LOW (ref 39.0–52.0)
Hemoglobin: 8 g/dL — ABNORMAL LOW (ref 13.0–17.0)
MCH: 29.4 pg (ref 26.0–34.0)
MCHC: 31.5 g/dL (ref 30.0–36.0)
MCV: 93.4 fL (ref 80.0–100.0)
Platelets: 432 10*3/uL — ABNORMAL HIGH (ref 150–400)
RBC: 2.72 MIL/uL — ABNORMAL LOW (ref 4.22–5.81)
RDW: 15.5 % (ref 11.5–15.5)
WBC: 7.6 10*3/uL (ref 4.0–10.5)
nRBC: 0 % (ref 0.0–0.2)

## 2021-12-04 LAB — RENAL FUNCTION PANEL
Albumin: 1.7 g/dL — ABNORMAL LOW (ref 3.5–5.0)
Anion gap: 9 (ref 5–15)
BUN: 10 mg/dL (ref 6–20)
CO2: 27 mmol/L (ref 22–32)
Calcium: 8.5 mg/dL — ABNORMAL LOW (ref 8.9–10.3)
Chloride: 103 mmol/L (ref 98–111)
Creatinine, Ser: 0.65 mg/dL (ref 0.61–1.24)
GFR, Estimated: >60 mL/min (ref >60)
Glucose, Bld: 92 mg/dL (ref 70–99)
Phosphorus: 4.5 mg/dL (ref 2.5–4.6)
Potassium: 3.8 mmol/L (ref 3.5–5.1)
Sodium: 139 mmol/L (ref 135–145)

## 2021-12-04 LAB — MAGNESIUM
Magnesium: 2 mg/dL (ref 1.7–2.4)
Magnesium: 1.5 mg/dL — ABNORMAL LOW (ref 1.7–2.4)

## 2021-12-04 LAB — TYPE AND SCREEN
ABO/RH(D): A POS
Antibody Screen: NEGATIVE
Unit division: 0

## 2021-12-04 LAB — BPAM RBC
Blood Product Expiration Date: 202304252359
ISSUE DATE / TIME: 202304020828
Unit Type and Rh: 6200

## 2021-12-04 IMAGING — CT CT ANGIO CHEST
2 of 6 series · 18 of 46 positions shown · IV contrast (agent unspecified)
Comparison: Chest radiograph [DATE] and CT [DATE]

CLINICAL DATA: Evaluate for pulmonary embolus. Respiratory failure.
History of epidural abscess and pneumonia.

EXAM:
CT ANGIOGRAPHY CHEST WITH CONTRAST
TECHNIQUE: Multidetector CT imaging of the chest was performed using the
standard protocol during bolus administration of intravenous
contrast. Multiplanar CT image reconstructions and MIPs were
obtained to evaluate the vascular anatomy.

[Series 6: thins · axial · 0.85mm/px · z∈[+1394,+1630]mm · 15 of 260 slices shown]
[im 12/260  lung]
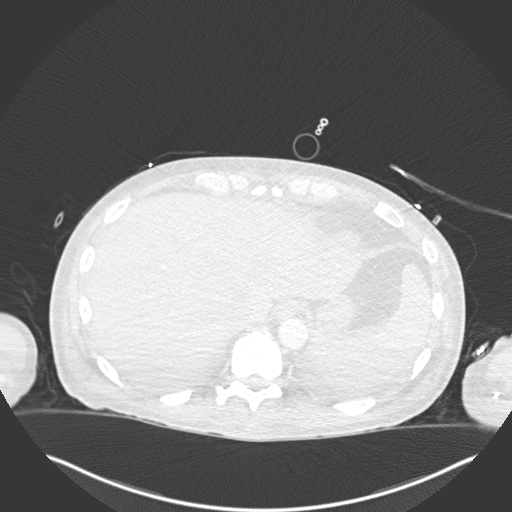
[im 34/260  soft-tissue]
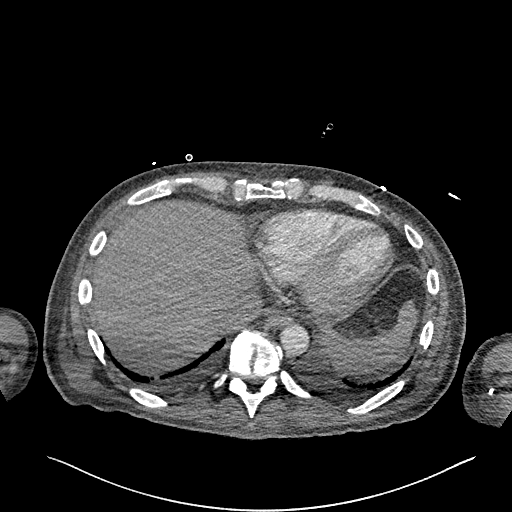
[im 46/260  lung]
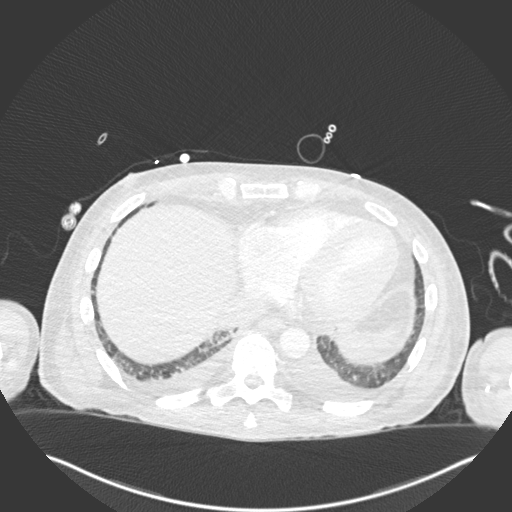
[im 68/260  soft-tissue]
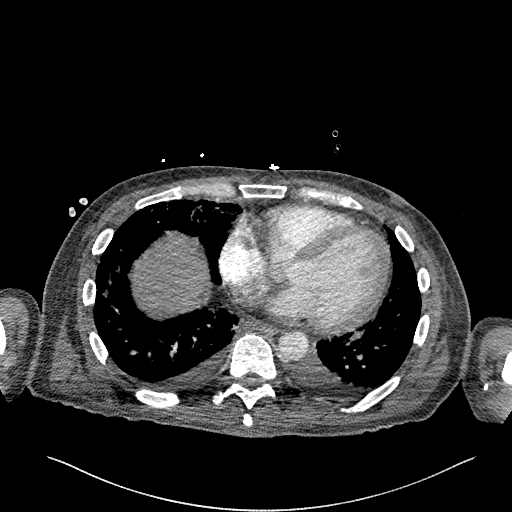
[im 79/260  lung]
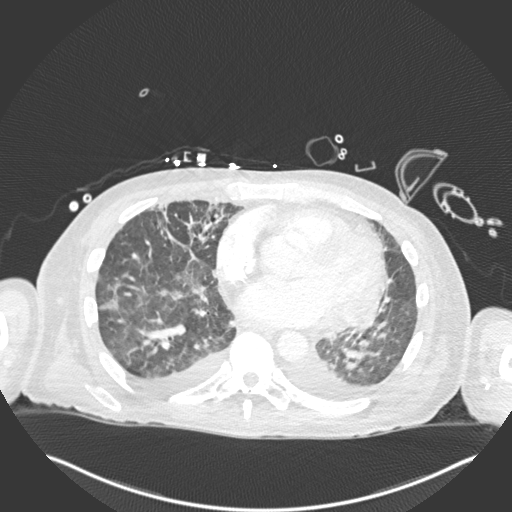
[im 102/260  soft-tissue]
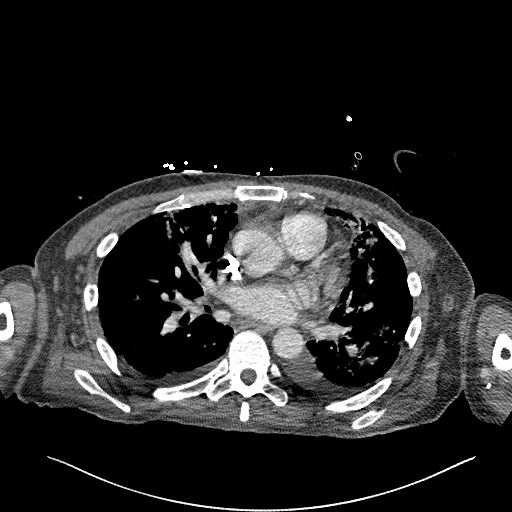
[im 113/260  lung]
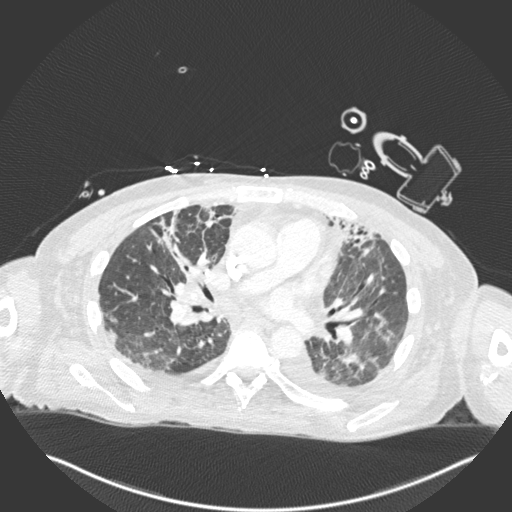
[im 136/260  soft-tissue]
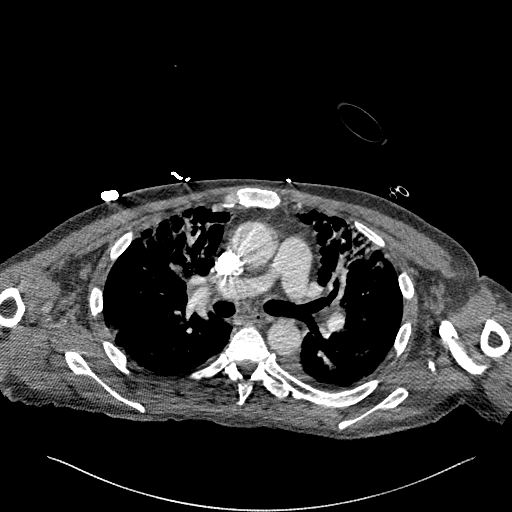
[im 147/260  lung]
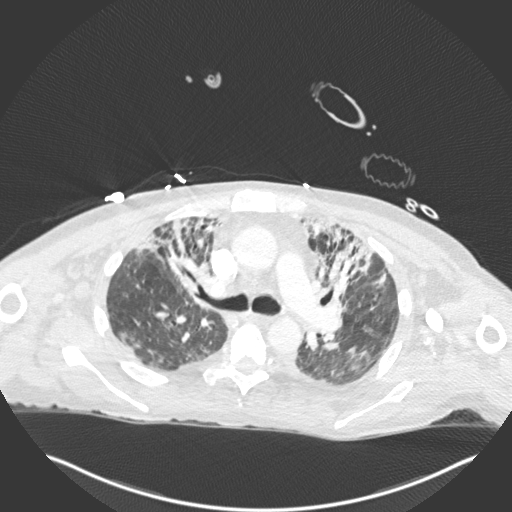
[im 158/260  soft-tissue]
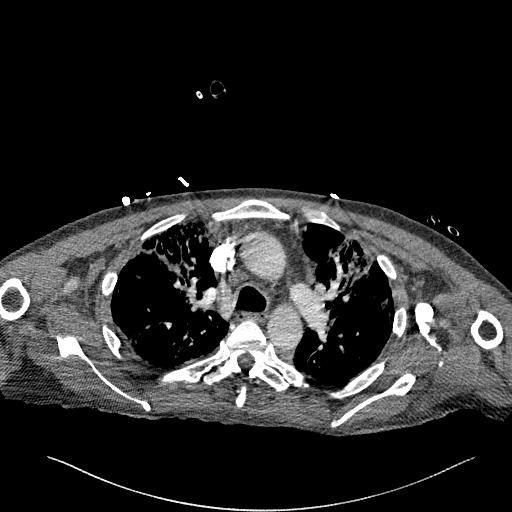
[im 181/260  lung]
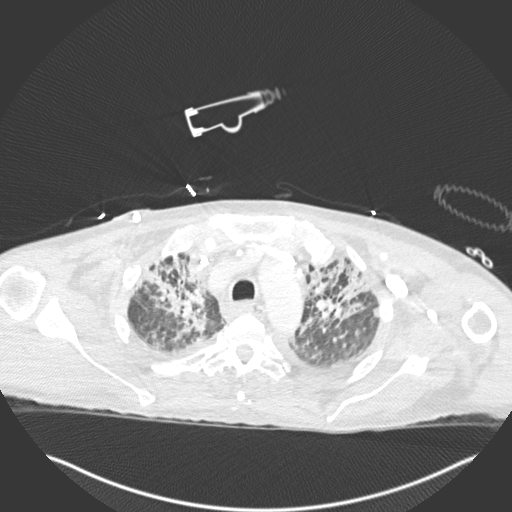
[im 192/260  soft-tissue]
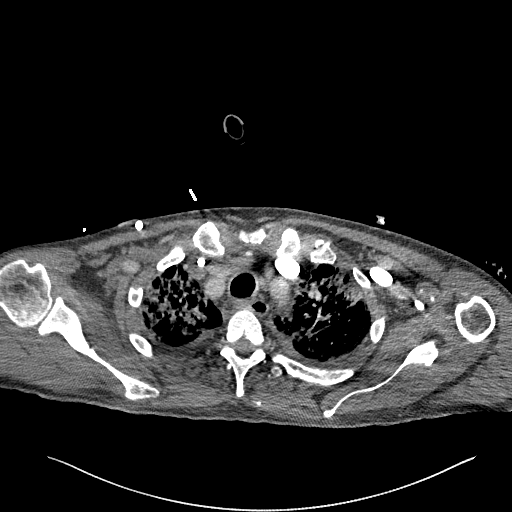
[im 214/260  lung]
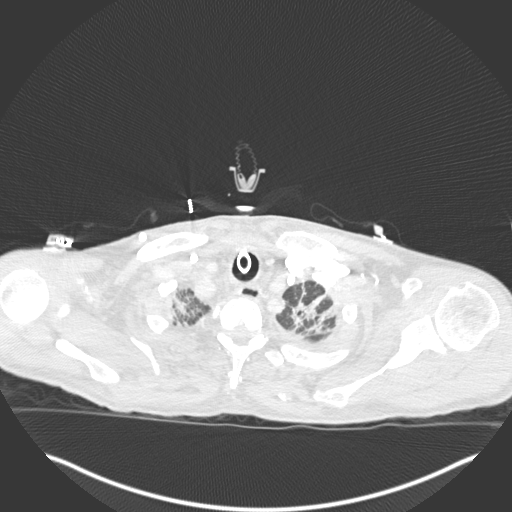
[im 226/260  soft-tissue]
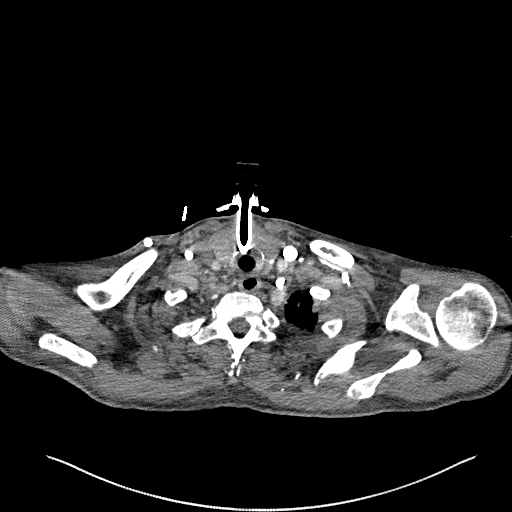
[im 248/260  lung]
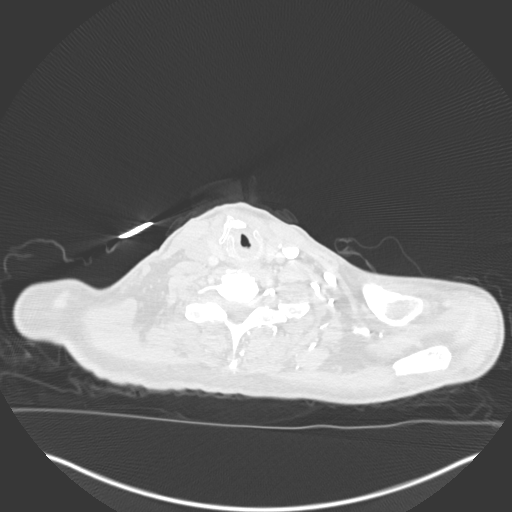

[Series 8: coronal mpr · coronal · 0.53mm/px · 3 of 124 slices shown]
[im 31/124  soft-tissue]
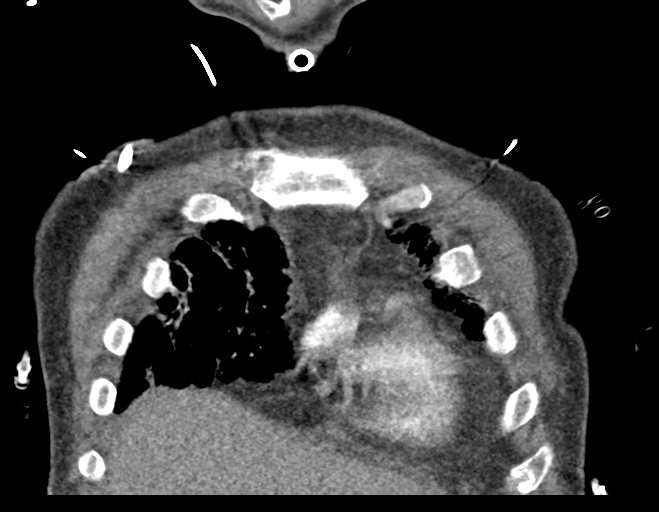
[im 62/124  soft-tissue]
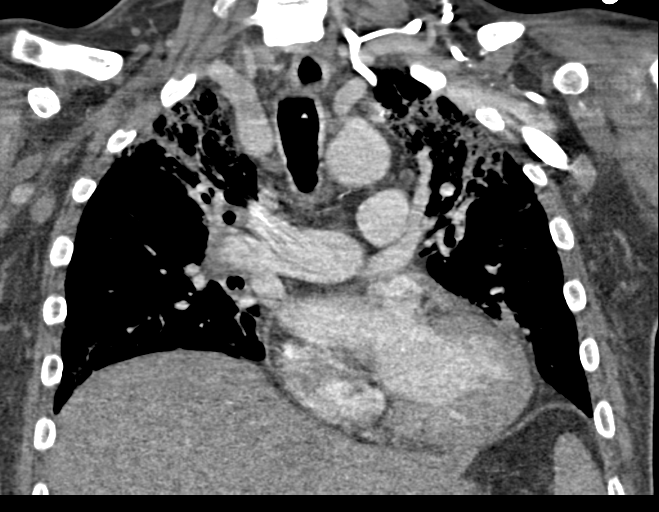
[im 93/124  soft-tissue]
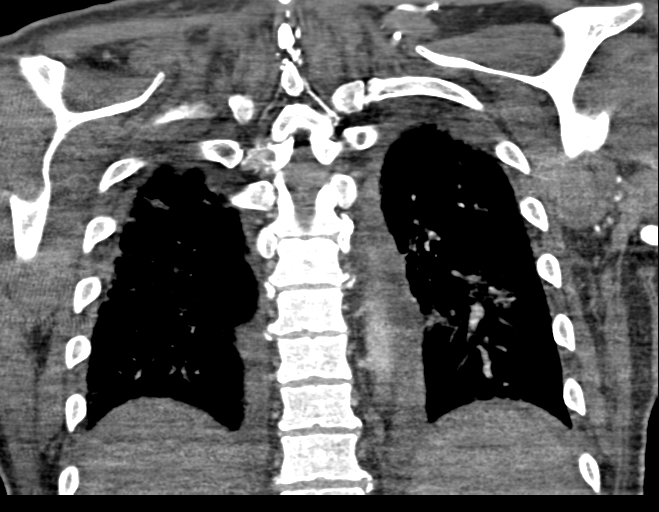

[18 of 46 positions shown; findings below may reference images not displayed]

RADIATION DOSE REDUCTION: This exam was performed according to the
departmental dose-optimization program which includes automated
exposure control, adjustment of the mA and/or kV according to
patient size and/or use of iterative reconstruction technique.

CONTRAST:  60mL OMNIPAQUE IOHEXOL 350 MG/ML SOLN
FINDINGS: Cardiovascular: Pulmonary arterial opacification is suboptimal. No
definite emboli are identified in the lobar or more proximal
pulmonary arteries. Segmental and subsegmental arteries cannot be
adequately evaluated. The heart is normal in size. There is no
pericardial effusion. The thoracic aorta is normal in caliber. A
right jugular catheter terminates near the superior cavoatrial
junction.

Mediastinum/Nodes: Tracheostomy tube in place with secretions in the
more proximal trachea. No enlarged axillary, mediastinal, or hilar
lymph nodes. Unremarkable thyroid and esophagus.

Lungs/Pleura: Small bilateral pleural effusions. No pneumothorax.
Patchy opacities scattered throughout both lower lobes. More
extensive, confluent opacities in both upper lobes which are largely
peribronchovascular with associated bronchiectasis and volume loss.

Upper Abdomen: No acute finding.

Musculoskeletal: No suspicious osseous lesion.

Review of the MIP images confirms the above findings.
IMPRESSION: 1. Suboptimal study without large central pulmonary emboli.
2. Small bilateral pleural effusions.
3. Bilateral lung opacities which may reflect pneumonia. Prominent
bilateral upper lobe opacities with associated bronchiectasis and
volume loss may reflect the sequelae of previous pneumonia.

## 2021-12-04 MED ORDER — IOHEXOL 350 MG/ML SOLN
100.0000 mL | Freq: Once | INTRAVENOUS | Status: AC | PRN
  Administered 2021-12-04: 60 mL via INTRAVENOUS

## 2021-12-04 NOTE — Progress Notes (Signed)
Pulmonary Critical Care Medicine ?[place] ?  ?PULMONARY CRITICAL CARE SERVICE ? ?PROGRESS NOTE ? ? ? ? ?Paul Mckenzie  ?[id number]  ?DOB: 1971/08/04  ? ?DOA: 12/01/2021 ? ?Referring Physician: Satira Sark, MD ? ?HPI: Paul Mckenzie is a 51 y.o. male being followed for ventilator/airway/oxygen weaning Acute on Chronic Respiratory Failure.  Patient is currently on the ventilator and full support should be able to try to resume weaning ? ?Medications: ?Reviewed on Rounds ? ?Physical Exam: ? ?Vitals: Temperature is 98.3 pulse 94 respiratory 22 blood pressure 155/91 saturations 98% ? ?Ventilator Settings on pressure assist control FiO2 is 28% IP 16 PEEP 5 ? ?General: Comfortable at this time ?Neck: supple ?Cardiovascular: no malignant arrhythmias ?Respiratory: No rhonchi very coarse breath sound ?Skin: no rash seen on limited exam ?Musculoskeletal: No gross abnormality ?Psychiatric:unable to assess ?Neurologic:no involuntary movements   ?   ?   ?Lab Data:  ? ?Basic Metabolic Panel: ?Recent Labs  ?Lab 12/02/21 ?0218 12/03/21 ?6314 12/03/21 ?2015 12/04/21 ?0403 12/04/21 ?1243  ?NA 137 142  --  139  --   ?K 4.2 3.7  --  3.8  --   ?CL 101 103  --  103  --   ?CO2 27 27  --  27  --   ?GLUCOSE 115* 113*  --  92  --   ?BUN 19 16  --  10  --   ?CREATININE 0.85 0.78  --  0.65  --   ?CALCIUM 8.3* 8.4*  --  8.5*  --   ?MG 1.9 1.4* 1.6* 2.0 1.5*  ?PHOS  --   --   --  4.5  --   ? ? ?ABG: ?Recent Labs  ?Lab 12/01/21 ?1943 12/02/21 ?0139 12/02/21 ?9702 12/03/21 ?1500  ?PHART 7.46* 7.36 7.26* 7.48*  ?PCO2ART 44 50* 63* 43  ?PO2ART 132* 89 282* 148*  ?HCO3 31.3* 27.7 28.3* 32.0*  ?O2SAT 99.2 97.1 98.8 98.4  ? ? ?Liver Function Tests: ?Recent Labs  ?Lab 12/02/21 ?0218 12/03/21 ?6378 12/04/21 ?0403  ?AST 15 14*  --   ?ALT 6 6  --   ?ALKPHOS 305* 243*  --   ?BILITOT 0.3 0.3  --   ?PROT 6.8 6.5  --   ?ALBUMIN 1.6* 1.5* 1.7*  ? ?No results for input(s): LIPASE, AMYLASE in the last 168 hours. ?Recent  Labs  ?Lab 12/03/21 ?1450  ?AMMONIA 35  ? ? ?CBC: ?Recent Labs  ?Lab 12/02/21 ?0218 12/03/21 ?5885 12/03/21 ?1450 12/04/21 ?0403  ?WBC 11.2* 8.1 8.9 7.6  ?NEUTROABS 8.7*  --   --   --   ?HGB 7.2* 6.4* 7.3* 8.0*  ?HCT 22.0* 20.3* 22.4* 25.4*  ?MCV 93.2 94.4 92.2 93.4  ?PLT 449* 450* 422* 432*  ? ? ?Cardiac Enzymes: ?Recent Labs  ?Lab 12/02/21 ?0218  ?CKTOTAL 28*  ? ? ?BNP (last 3 results) ?Recent Labs  ?  12/02/21 ?0736  ?BNP 239.5*  ? ? ?ProBNP (last 3 results) ?No results for input(s): PROBNP in the last 8760 hours. ? ?Radiological Exams: ?CT Angio Chest Pulmonary Embolism (PE) W or WO Contrast ? ?Result Date: 12/04/2021 ?CLINICAL DATA:  Evaluate for pulmonary embolus. Respiratory failure. History of epidural abscess and pneumonia. EXAM: CT ANGIOGRAPHY CHEST WITH CONTRAST TECHNIQUE: Multidetector CT imaging of the chest was performed using the standard protocol during bolus administration of intravenous contrast. Multiplanar CT image reconstructions and MIPs were obtained to evaluate the vascular anatomy. RADIATION DOSE REDUCTION: This exam was performed according to the departmental  dose-optimization program which includes automated exposure control, adjustment of the mA and/or kV according to patient size and/or use of iterative reconstruction technique. CONTRAST:  74m OMNIPAQUE IOHEXOL 350 MG/ML SOLN COMPARISON:  Chest radiograph 12/03/2021 and CT 10/24/2021 FINDINGS: Cardiovascular: Pulmonary arterial opacification is suboptimal. No definite emboli are identified in the lobar or more proximal pulmonary arteries. Segmental and subsegmental arteries cannot be adequately evaluated. The heart is normal in size. There is no pericardial effusion. The thoracic aorta is normal in caliber. A right jugular catheter terminates near the superior cavoatrial junction. Mediastinum/Nodes: Tracheostomy tube in place with secretions in the more proximal trachea. No enlarged axillary, mediastinal, or hilar lymph nodes.  Unremarkable thyroid and esophagus. Lungs/Pleura: Small bilateral pleural effusions. No pneumothorax. Patchy opacities scattered throughout both lower lobes. More extensive, confluent opacities in both upper lobes which are largely peribronchovascular with associated bronchiectasis and volume loss. Upper Abdomen: No acute finding. Musculoskeletal: No suspicious osseous lesion. Review of the MIP images confirms the above findings. IMPRESSION: 1. Suboptimal study without large central pulmonary emboli. 2. Small bilateral pleural effusions. 3. Bilateral lung opacities which may reflect pneumonia. Prominent bilateral upper lobe opacities with associated bronchiectasis and volume loss may reflect the sequelae of previous pneumonia. Electronically Signed   By: ALogan BoresM.D.   On: 12/04/2021 11:18  ? ?DG CHEST PORT 1 VIEW ? ?Result Date: 12/03/2021 ?CLINICAL DATA:  Pulmonary infiltrates. EXAM: PORTABLE CHEST 1 VIEW COMPARISON:  12/02/2021 FINDINGS: 1336 hours. SIRT cardiomegaly bilateral diffuse airspace disease is similar to prior. Tracheostomy tube and right IJ central line again noted. Telemetry leads overlie the chest. IMPRESSION: No substantial interval change in exam. Diffuse bilateral airspace disease. Electronically Signed   By: EMisty StanleyM.D.   On: 12/03/2021 13:45  ? ?ECHOCARDIOGRAM COMPLETE ? ?Result Date: 12/02/2021 ?   ECHOCARDIOGRAM REPORT   Patient Name:   Paul DORNFELDDate of Exam: 12/02/2021 Medical Rec #:  [id number]             Height: Accession #:    [id number]            Weight: Date of Birth:  8July 22, 1972             BSA: Patient Age:    89years               BP:           0/0 mmHg Patient Gender: M                      HR:           101 bpm. Exam Location:  Inpatient Procedure: 2D Echo, Cardiac Doppler and Color Doppler Indications:    Cardiac arrest  History:        Patient has no prior history of Echocardiogram examinations.                 Acute MI.  Sonographer:    DWenda Low Referring Phys: [id number] Forest Sonographer Comments: Dr. KDoylene Canardto read IMPRESSIONS  1. Left ventricular ejection fraction, by estimation, is 60 to 65%. The left ventricle has normal function. The left ventricle has no regional wall motion abnormalities. Left ventricular diastolic parameters were normal.  2. Right ventricular systolic function is normal. The right ventricular size is normal. Tricuspid regurgitation signal is inadequate for assessing PA pressure.  3. The mitral valve is normal in structure. Trivial mitral valve regurgitation. No evidence  of mitral stenosis.  4. The aortic valve is normal in structure. Aortic valve regurgitation is not visualized. No aortic stenosis is present.  5. Aortic dilatation noted. There is borderline dilatation of the aortic root, measuring 38 mm.  6. The inferior vena cava is normal in size with greater than 50% respiratory variability, suggesting right atrial pressure of 3 mmHg. Comparison(s): No prior Echocardiogram. FINDINGS  Left Ventricle: Left ventricular ejection fraction, by estimation, is 60 to 65%. The left ventricle has normal function. The left ventricle has no regional wall motion abnormalities. The left ventricular internal cavity size was normal in size. There is  no left ventricular hypertrophy. Left ventricular diastolic parameters were normal. Right Ventricle: The right ventricular size is normal. Right ventricular systolic function is normal. Tricuspid regurgitation signal is inadequate for assessing PA pressure. The tricuspid regurgitant velocity is 2.14 m/s, and with an assumed right atrial  pressure of 8 mmHg, the estimated right ventricular systolic pressure is 84.6 mmHg. Left Atrium: Left atrial size was normal in size. Right Atrium: Right atrial size was normal in size. Pericardium: There is no evidence of pericardial effusion. Mitral Valve: The mitral valve is normal in structure. Trivial mitral valve regurgitation. No evidence of mitral  valve stenosis. MV peak gradient, 5.5 mmHg. The mean mitral valve gradient is 3.0 mmHg. Tricuspid Valve: The tricuspid valve is normal in structure. Tricuspid valve regurgitation is trivial. No evidence of tricuspi

## 2021-12-05 ENCOUNTER — Other Ambulatory Visit (HOSPITAL_COMMUNITY): Payer: Self-pay

## 2021-12-05 DIAGNOSIS — A419 Sepsis, unspecified organism: Secondary | ICD-10-CM | POA: Diagnosis not present

## 2021-12-05 DIAGNOSIS — F10931 Alcohol use, unspecified with withdrawal delirium: Secondary | ICD-10-CM | POA: Diagnosis not present

## 2021-12-05 DIAGNOSIS — N170 Acute kidney failure with tubular necrosis: Secondary | ICD-10-CM | POA: Diagnosis not present

## 2021-12-05 DIAGNOSIS — J9621 Acute and chronic respiratory failure with hypoxia: Secondary | ICD-10-CM | POA: Diagnosis not present

## 2021-12-05 HISTORY — PX: IR REPLC GASTRO/COLONIC TUBE PERCUT W/FLUORO: IMG2333

## 2021-12-05 LAB — MAGNESIUM: Magnesium: 1.7 mg/dL (ref 1.7–2.4)

## 2021-12-05 IMAGING — XA IR REPLACE G-TUBE/COLONIC TUBE
3 series · 11 of 11 positions shown · non-contrast
Comparison: None

INDICATION: Patient history acute and chronic respiratory failure failure. On
ventilator support with gastrostomy tube for nutritional purposes.

Request is for gastrostomy tube replacement.
EXAM:
GASTROSTOMY TUBE REPLACEMENT

[Series 1: fl neuro n · 1 of 1 slices shown (1 of 3)]
[im 1/1]
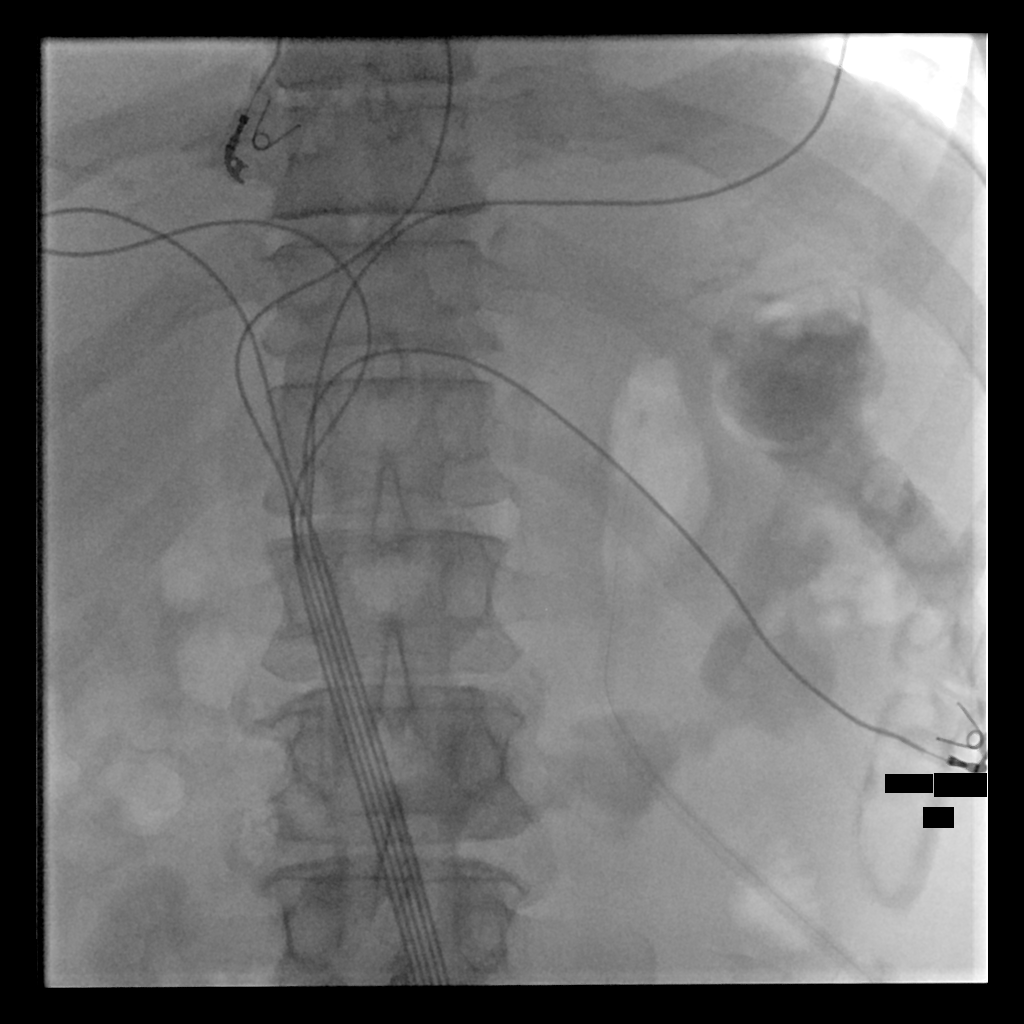

[Series 2: fl neuro n · 4 of 42 frames shown (2 of 3)]
[frame 7/42]
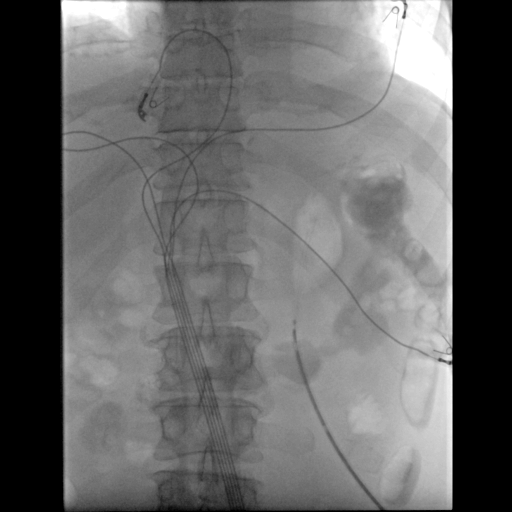
[frame 22/42]
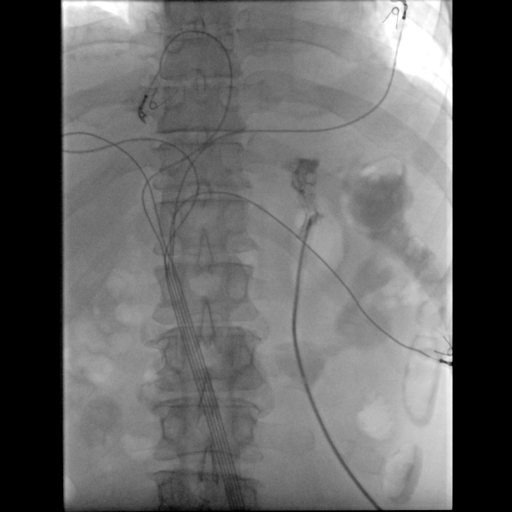
[frame 36/42]
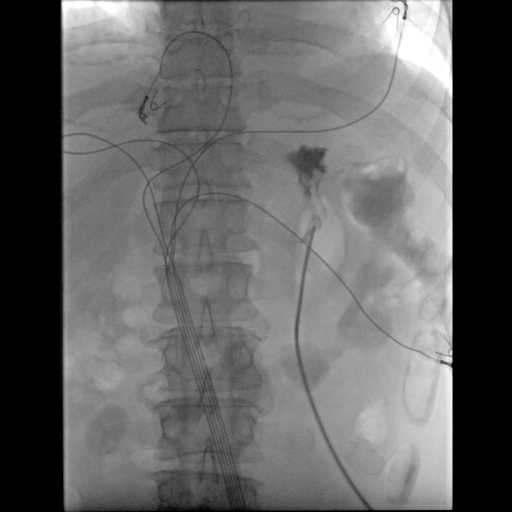
[frame 37/42]
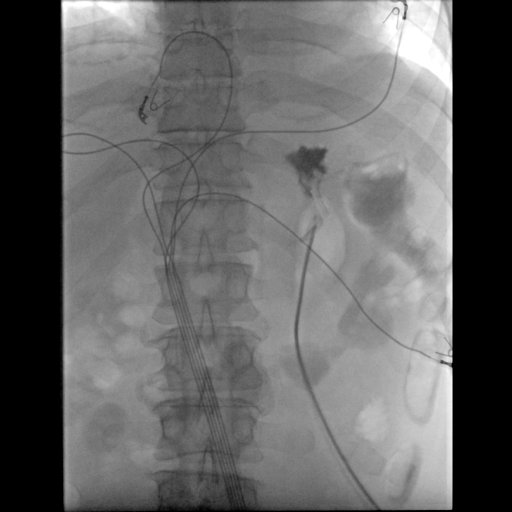

[Series 3: fl neuro n · 3 acquisitions, 6 frames shown (3 of 3)]
[im 1/3]
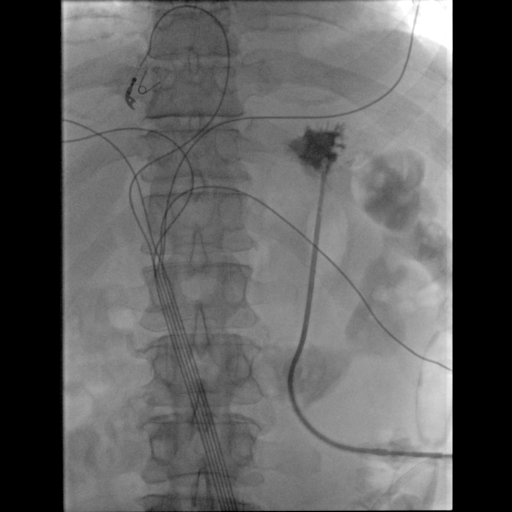
[im 1/3]
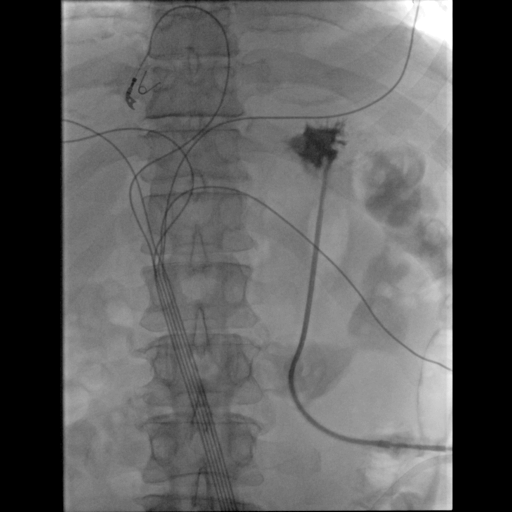
[im 1/3]
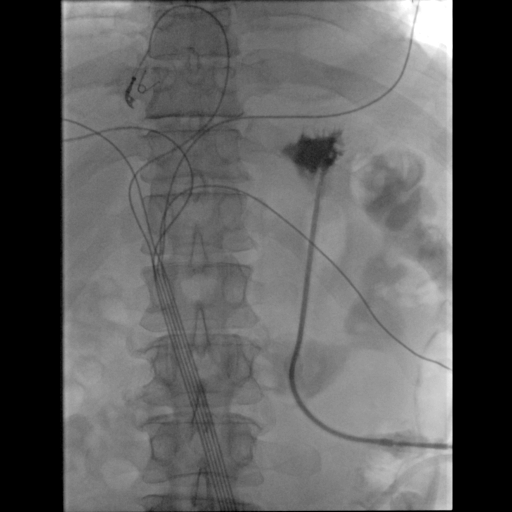
[im 1/3]
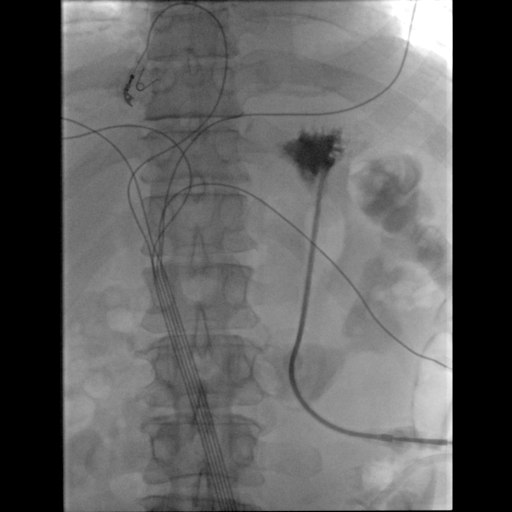
[im 2/3]
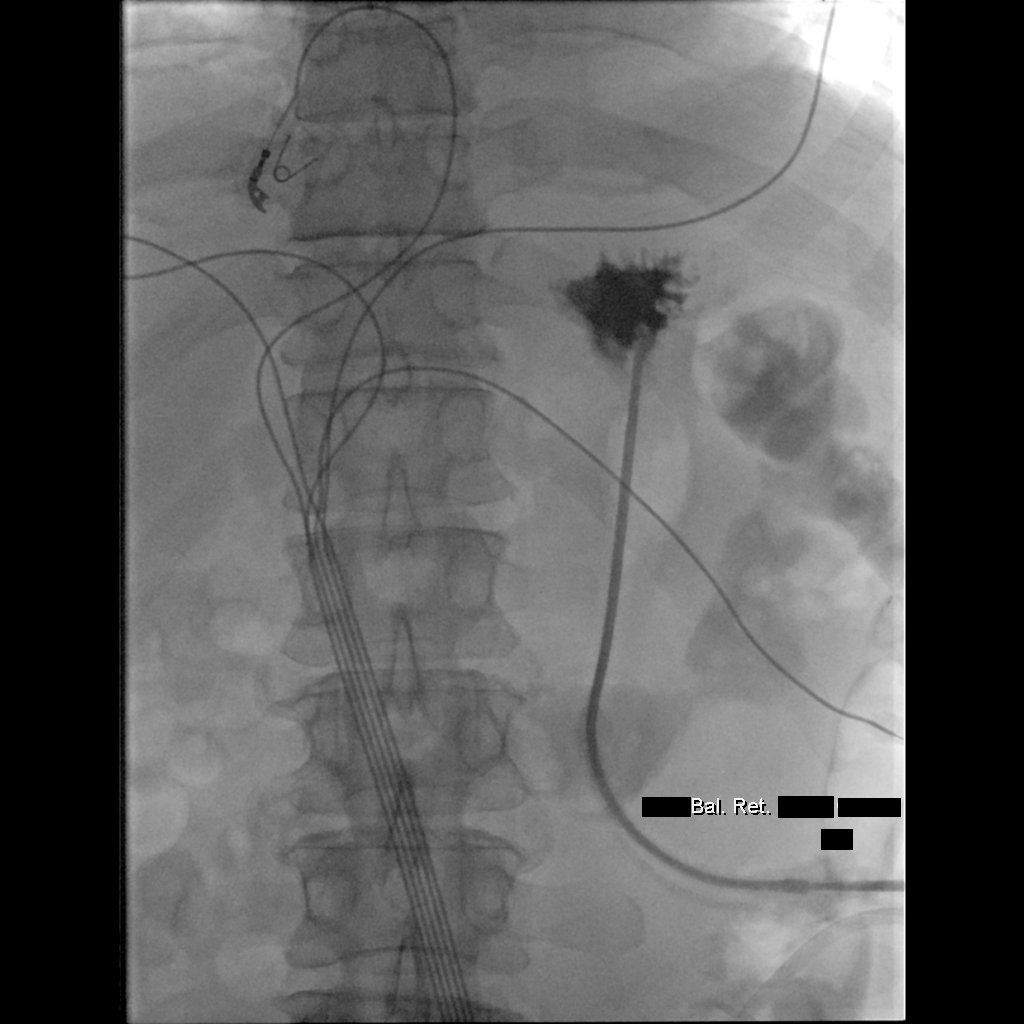
[im 3/3]
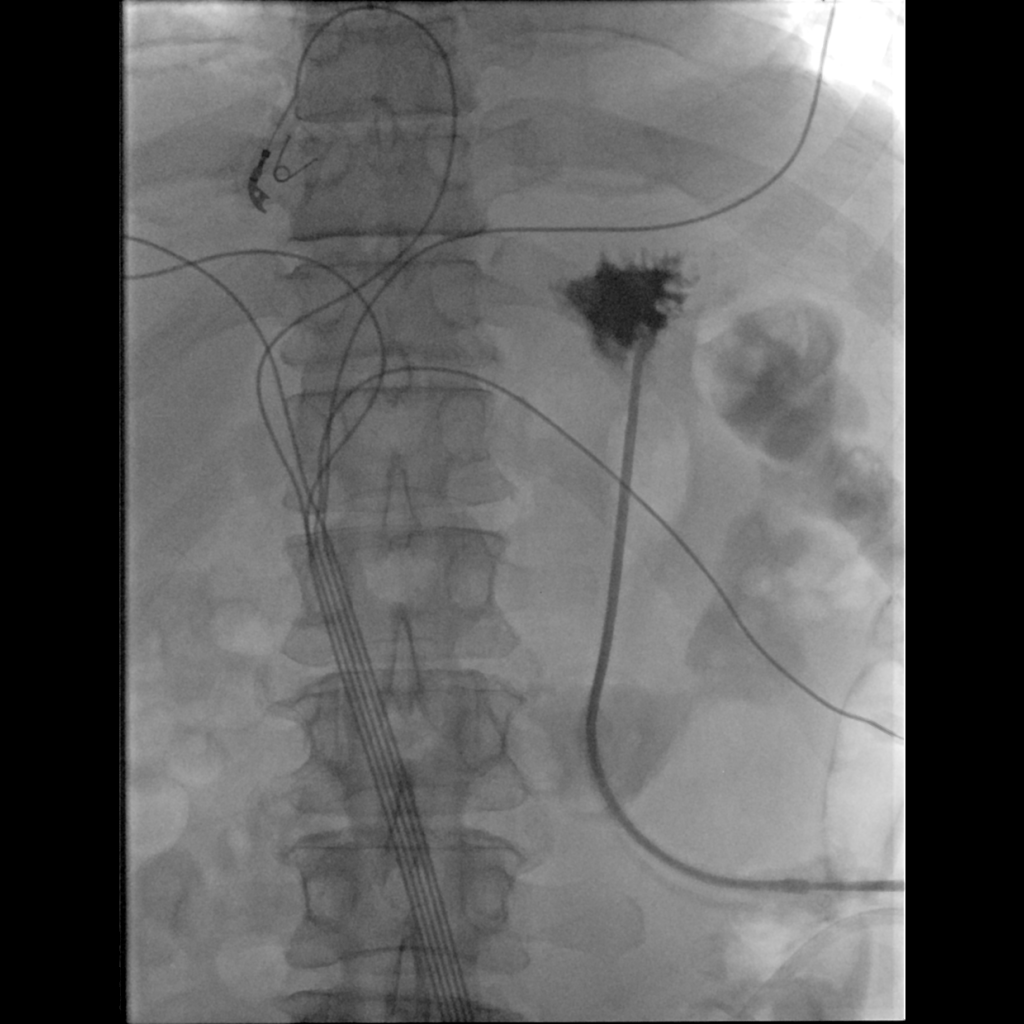

[11 of 11 positions shown; findings below may reference images not displayed]

MEDICATIONS:
No; Antibiotics were administered within 1 hour of the procedure.

CONTRAST:  10 mL of Isovue 300 administered into the gastric lumen.

ANESTHESIA/SEDATION:
Moderate (conscious) sedation was not employed during this procedure

FLUOROSCOPY TIME:  (1 mGy)

COMPLICATIONS:
None immediate.

PROCEDURE:
Informed written consent was obtained from the patient and/or
patient's representative following explanation of the procedure,
risks, benefits and alternatives. A time out was performed prior to
the initiation of the procedure. Maximal barrier sterile technique
utilized including caps, mask, sterile gowns, sterile gloves, and
hand hygiene.

The LEFT upper quadrant was sterilely prepped and draped. Under
intermittent fluoroscopic guidance, the place NAZARETH catheter was
exchanged for a Amplatz, ultimately allowing placement of a 18 Fr
balloon retention gastrostomy tube. The retention balloon was
insufflated with a mixture of dilute saline and pulled taut against
the anterior wall of the stomach. The external disc was cinched.
Contrast injection confirms positioning within the stomach. Spot
radiographic images confirmed placement within the stomach. The
patient tolerated procedure well without immediate post procedural
complication.
FINDINGS: After successful fluoroscopic guided replacement, the gastrostomy
tube is appropriately positioned with internal retention balloon
against the ventral aspect of the gastric lumen.
IMPRESSION: Successful fluoroscopic replacement for a 18 Fr balloon- retention
gastrostomy tube, as above.

The gastrostomy tube is available to use at this time.

Read by  NAZARETH, IR NP

## 2021-12-05 MED ORDER — LIDOCAINE VISCOUS HCL 2 % MT SOLN
OROMUCOSAL | Status: AC
  Filled 2021-12-05: qty 15

## 2021-12-05 MED ORDER — IOHEXOL 300 MG/ML  SOLN
50.0000 mL | Freq: Once | INTRAMUSCULAR | Status: AC | PRN
  Administered 2021-12-05: 10 mL

## 2021-12-05 NOTE — Procedures (Signed)
Exchange of existing Foley Tube placeholder for  22 Fr  using fluoroscopy guidance. New tube positioned appropriately.  8 ml of normal saline instilled in the retention balloon port and the flange repositioned for comfort. ?  ?  ?Ok to begin using gastrostomy   Please call IR for any questions or concerns regarding the g-tube ? ?

## 2021-12-05 NOTE — Progress Notes (Signed)
Pulmonary Critical Care Medicine ?[place] ?  ?PULMONARY CRITICAL CARE SERVICE ? ?PROGRESS NOTE ? ? ? ? ?Paul Mckenzie  ?OMV:[id number]  ?DOB: [birth year]  ? ?DOA: 12/01/2021 ? ?Referring Physician: Satira Sark, MD ? ?HPI: Paul Mckenzie is a 51 y.o. male being followed for ventilator/airway/oxygen weaning Acute on Chronic Respiratory Failure.  Currently is on pressure support mode has been on 28% FiO2 with a goal of 4 hours ? ?Medications: ?Reviewed on Rounds ? ?Physical Exam: ? ?Vitals: Temperature is 99.0 pulse 90 respiratory 25 blood pressure 140/86 saturations 100% ? ?Ventilator Settings on pressure support FiO2 is 28% pressure 12/5 ? ?General: Comfortable at this time ?Neck: supple ?Cardiovascular: no malignant arrhythmias ?Respiratory: No rhonchi very coarse breath sounds ?Skin: no rash seen on limited exam ?Musculoskeletal: No gross abnormality ?Psychiatric:unable to assess ?Neurologic:no involuntary movements   ?   ?   ?Lab Data:  ? ?Basic Metabolic Panel: ?Recent Labs  ?Lab 12/02/21 ?0218 12/03/21 ?6283 12/03/21 ?2015 12/04/21 ?0403 12/04/21 ?1243 12/05/21 ?6629  ?NA 137 142  --  139  --   --   ?K 4.2 3.7  --  3.8  --   --   ?CL 101 103  --  103  --   --   ?CO2 27 27  --  27  --   --   ?GLUCOSE 115* 113*  --  92  --   --   ?BUN 19 16  --  10  --   --   ?CREATININE 0.85 0.78  --  0.65  --   --   ?CALCIUM 8.3* 8.4*  --  8.5*  --   --   ?MG 1.9 1.4* 1.6* 2.0 1.5* 1.7  ?PHOS  --   --   --  4.5  --   --   ? ? ?ABG: ?Recent Labs  ?Lab 12/01/21 ?1943 12/02/21 ?0139 12/02/21 ?4765 12/03/21 ?1500  ?PHART 7.46* 7.36 7.26* 7.48*  ?PCO2ART 44 50* 63* 43  ?PO2ART 132* 89 282* 148*  ?HCO3 31.3* 27.7 28.3* 32.0*  ?O2SAT 99.2 97.1 98.8 98.4  ? ? ?Liver Function Tests: ?Recent Labs  ?Lab 12/02/21 ?0218 12/03/21 ?4650 12/04/21 ?0403  ?AST 15 14*  --   ?ALT 6 6  --   ?ALKPHOS 305* 243*  --   ?BILITOT 0.3 0.3  --   ?PROT 6.8 6.5  --   ?ALBUMIN 1.6* 1.5* 1.7*  ? ?No results for input(s):  LIPASE, AMYLASE in the last 168 hours. ?Recent Labs  ?Lab 12/03/21 ?1450  ?AMMONIA 35  ? ? ?CBC: ?Recent Labs  ?Lab 12/02/21 ?0218 12/03/21 ?3546 12/03/21 ?1450 12/04/21 ?0403  ?WBC 11.2* 8.1 8.9 7.6  ?NEUTROABS 8.7*  --   --   --   ?HGB 7.2* 6.4* 7.3* 8.0*  ?HCT 22.0* 20.3* 22.4* 25.4*  ?MCV 93.2 94.4 92.2 93.4  ?PLT 449* 450* 422* 432*  ? ? ?Cardiac Enzymes: ?Recent Labs  ?Lab 12/02/21 ?0218  ?CKTOTAL 28*  ? ? ?BNP (last 3 results) ?Recent Labs  ?  12/02/21 ?0736  ?BNP 239.5*  ? ? ?ProBNP (last 3 results) ?No results for input(s): PROBNP in the last 8760 hours. ? ?Radiological Exams: ?CT Angio Chest Pulmonary Embolism (PE) W or WO Contrast ? ?Result Date: 12/04/2021 ?CLINICAL DATA:  Evaluate for pulmonary embolus. Respiratory failure. History of epidural abscess and pneumonia. EXAM: CT ANGIOGRAPHY CHEST WITH CONTRAST TECHNIQUE: Multidetector CT imaging of the chest was performed using the standard protocol during bolus administration  of intravenous contrast. Multiplanar CT image reconstructions and MIPs were obtained to evaluate the vascular anatomy. RADIATION DOSE REDUCTION: This exam was performed according to the departmental dose-optimization program which includes automated exposure control, adjustment of the mA and/or kV according to patient size and/or use of iterative reconstruction technique. CONTRAST:  30m OMNIPAQUE IOHEXOL 350 MG/ML SOLN COMPARISON:  Chest radiograph 12/03/2021 and CT 10/24/2021 FINDINGS: Cardiovascular: Pulmonary arterial opacification is suboptimal. No definite emboli are identified in the lobar or more proximal pulmonary arteries. Segmental and subsegmental arteries cannot be adequately evaluated. The heart is normal in size. There is no pericardial effusion. The thoracic aorta is normal in caliber. A right jugular catheter terminates near the superior cavoatrial junction. Mediastinum/Nodes: Tracheostomy tube in place with secretions in the more proximal trachea. No enlarged axillary,  mediastinal, or hilar lymph nodes. Unremarkable thyroid and esophagus. Lungs/Pleura: Small bilateral pleural effusions. No pneumothorax. Patchy opacities scattered throughout both lower lobes. More extensive, confluent opacities in both upper lobes which are largely peribronchovascular with associated bronchiectasis and volume loss. Upper Abdomen: No acute finding. Musculoskeletal: No suspicious osseous lesion. Review of the MIP images confirms the above findings. IMPRESSION: 1. Suboptimal study without large central pulmonary emboli. 2. Small bilateral pleural effusions. 3. Bilateral lung opacities which may reflect pneumonia. Prominent bilateral upper lobe opacities with associated bronchiectasis and volume loss may reflect the sequelae of previous pneumonia. Electronically Signed   By: ALogan BoresM.D.   On: 12/04/2021 11:18  ? ?DG CHEST PORT 1 VIEW ? ?Result Date: 12/03/2021 ?CLINICAL DATA:  Pulmonary infiltrates. EXAM: PORTABLE CHEST 1 VIEW COMPARISON:  12/02/2021 FINDINGS: 1336 hours. SIRT cardiomegaly bilateral diffuse airspace disease is similar to prior. Tracheostomy tube and right IJ central line again noted. Telemetry leads overlie the chest. IMPRESSION: No substantial interval change in exam. Diffuse bilateral airspace disease. Electronically Signed   By: EMisty StanleyM.D.   On: 12/03/2021 13:45   ? ?Assessment/Plan ?Active Problems: ?  Acute on chronic respiratory failure with hypoxia (HCC) ?  Spinal epidural abscess ?  [place]ium [place] ?  Acute renal failure due to tubular necrosis (HCC) ?  Severe sepsis [place] ? ? ?Acute on chronic respiratory failure with hypoxia plan is to continue with the weaning on pressure support goal of 4 hours today ?Epidural abscess in the spinal area supportive care prognosis remains guarded ?Alcohol withdrawal no sign of active withdrawal at this time ?Acute renal failure resolved ?Severe sepsis treated with antibiotics resolving ? ? ?I have personally seen  and evaluated the patient, evaluated laboratory and imaging results, formulated the assessment and plan and placed orders. ?The Patient requires high complexity decision making with multiple systems involvement.  ?Rounds were done with the Respiratory Therapy Director and Staff therapists and discussed with nursing staff also. ? ?SAllyne Gee MD FCCP ?Pulmonary Critical Care Medicine ?Sleep Medicine ? ?

## 2021-12-06 ENCOUNTER — Other Ambulatory Visit (HOSPITAL_COMMUNITY): Payer: Self-pay

## 2021-12-06 DIAGNOSIS — J9621 Acute and chronic respiratory failure with hypoxia: Secondary | ICD-10-CM | POA: Diagnosis not present

## 2021-12-06 DIAGNOSIS — A419 Sepsis, unspecified organism: Secondary | ICD-10-CM | POA: Diagnosis not present

## 2021-12-06 DIAGNOSIS — N170 Acute kidney failure with tubular necrosis: Secondary | ICD-10-CM | POA: Diagnosis not present

## 2021-12-06 DIAGNOSIS — F10931 Alcohol use, unspecified with withdrawal delirium: Secondary | ICD-10-CM | POA: Diagnosis not present

## 2021-12-06 LAB — CBC
HCT: 24.1 % — ABNORMAL LOW (ref 39.0–52.0)
Hemoglobin: 7.9 g/dL — ABNORMAL LOW (ref 13.0–17.0)
MCH: 30.4 pg (ref 26.0–34.0)
MCHC: 32.8 g/dL (ref 30.0–36.0)
MCV: 92.7 fL (ref 80.0–100.0)
Platelets: 435 10*3/uL — ABNORMAL HIGH (ref 150–400)
RBC: 2.6 MIL/uL — ABNORMAL LOW (ref 4.22–5.81)
RDW: 15.2 % (ref 11.5–15.5)
WBC: 11.5 10*3/uL — ABNORMAL HIGH (ref 4.0–10.5)
nRBC: 0 % (ref 0.0–0.2)

## 2021-12-06 LAB — BASIC METABOLIC PANEL
Anion gap: 7 (ref 5–15)
BUN: 14 mg/dL (ref 6–20)
CO2: 26 mmol/L (ref 22–32)
Calcium: 7.9 mg/dL — ABNORMAL LOW (ref 8.9–10.3)
Chloride: 101 mmol/L (ref 98–111)
Creatinine, Ser: 0.76 mg/dL (ref 0.61–1.24)
GFR, Estimated: >60 mL/min (ref >60)
Glucose, Bld: 123 mg/dL — ABNORMAL HIGH (ref 70–99)
Potassium: 3.7 mmol/L (ref 3.5–5.1)
Sodium: 134 mmol/L — ABNORMAL LOW (ref 135–145)

## 2021-12-06 LAB — URINALYSIS, ROUTINE W REFLEX MICROSCOPIC
Bilirubin Urine: NEGATIVE
Glucose, UA: NEGATIVE mg/dL
Ketones, ur: NEGATIVE mg/dL
Nitrite: NEGATIVE
Protein, ur: 30 mg/dL — AB
RBC / HPF: >50 RBC/hpf — ABNORMAL HIGH (ref 0–5)
Specific Gravity, Urine: 1.02 (ref 1.005–1.030)
pH: 5 (ref 5.0–8.0)

## 2021-12-06 LAB — MAGNESIUM: Magnesium: 1.7 mg/dL (ref 1.7–2.4)

## 2021-12-06 IMAGING — DX DG CHEST 1V PORT
1 series · 1 of 1 positions shown · non-contrast
Comparison: Radiograph [DATE], chest CT [DATE]

CLINICAL DATA: Shortness of breath and congestion, history of
pneumonia

EXAM:
PORTABLE CHEST 1 VIEW

[chest]
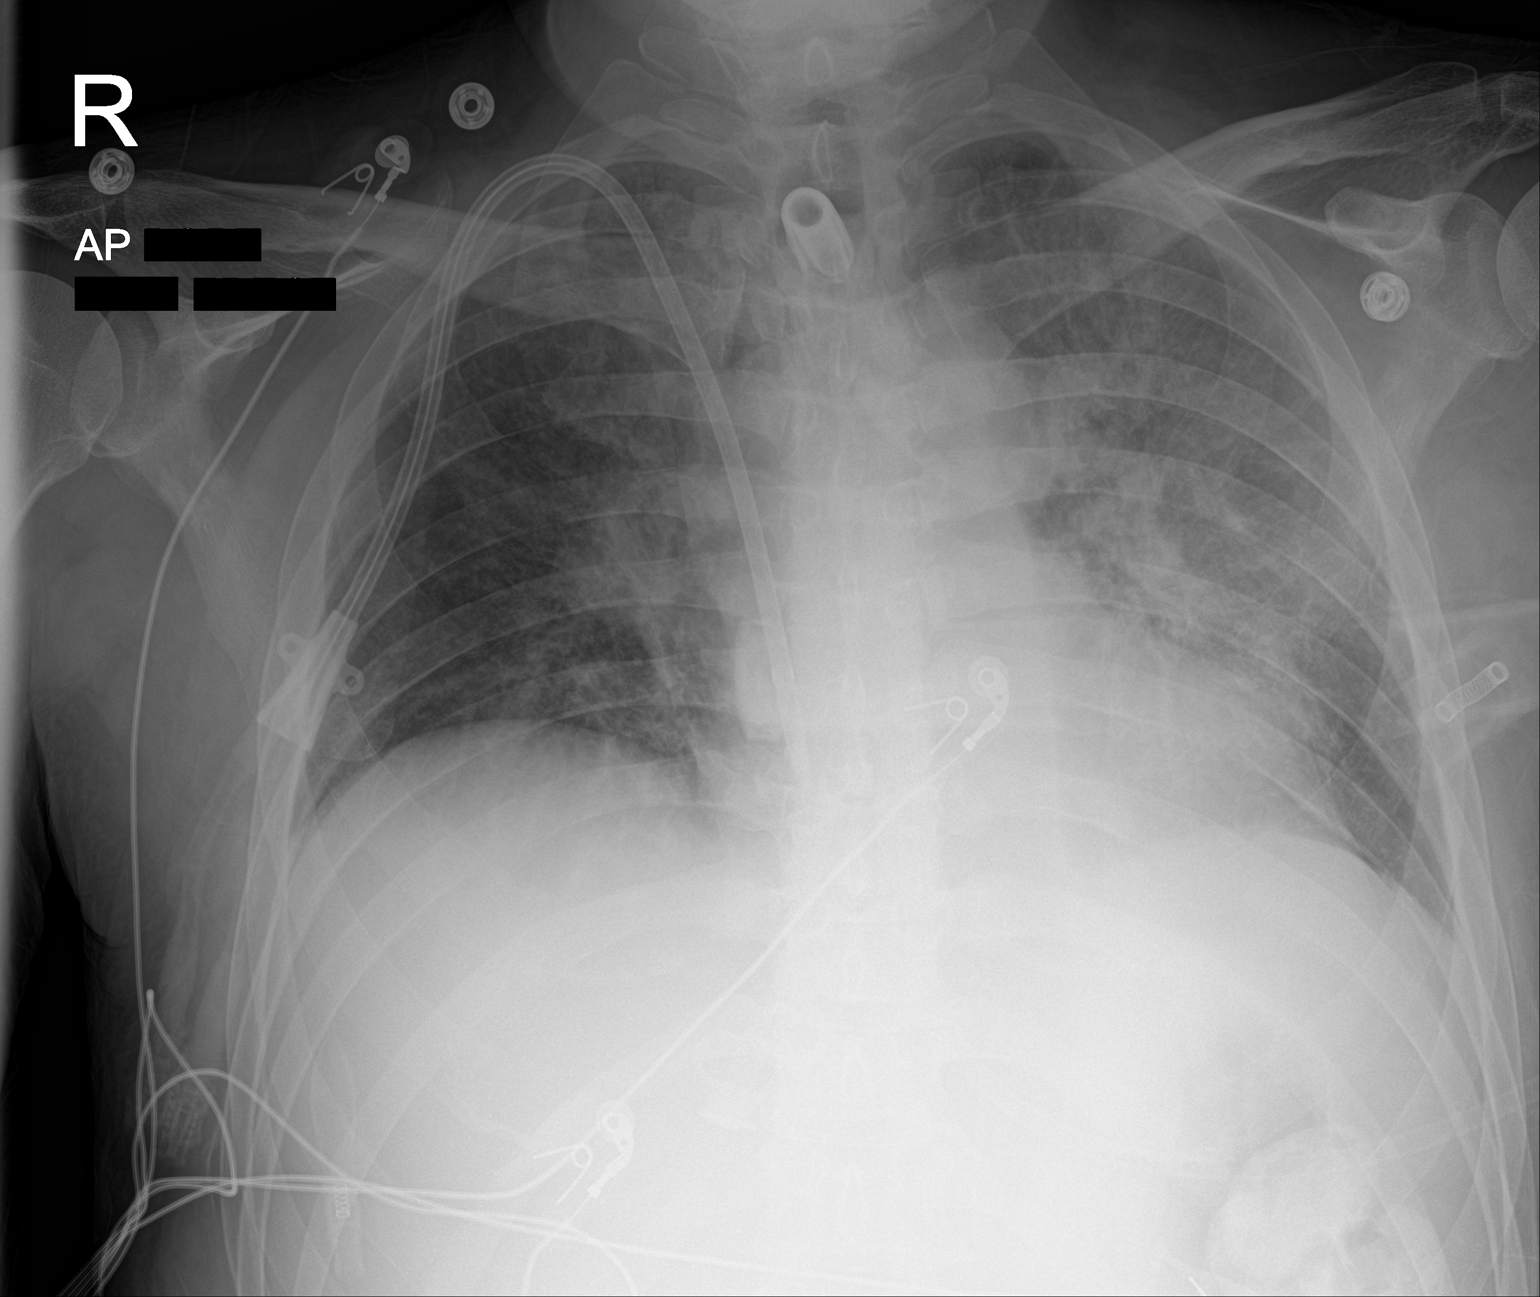

[1 of 1 positions shown; findings below may reference images not displayed]

FINDINGS: Tracheostomy tube overlies the mid trachea. Unchanged
cardiomediastinal silhouette. Unchanged right neck catheter with tip
overlying the right atrium. There is persistent bilateral airspace
disease, left greater than right, increased in the left upper lung.
Stable small bilateral pleural effusions. No pneumothorax. No acute
osseous abnormality.
IMPRESSION: Persistent bilateral airspace disease, increased in the left upper
lung. Stable small bilateral pleural effusions.

## 2021-12-06 NOTE — Progress Notes (Signed)
Pulmonary Critical Care Medicine ?[place] ?  ?PULMONARY CRITICAL CARE SERVICE ? ?PROGRESS NOTE ? ? ? ? ?Gillian Shields Bowersox  ?ZOX:[id number]  ?DOB: 01-20-1971  ? ?DOA: 12/01/2021 ? ?Referring Physician: Satira Sark, MD ? ?HPI: Christpoher Sievers is a 51 y.o. male being followed for ventilator/airway/oxygen weaning Acute on Chronic Respiratory Failure.  Patient is currently on pressure support mode is going to wean for 8 hours ? ?Medications: ?Reviewed on Rounds ? ?Physical Exam: ? ?Vitals: Temperature 98.0 pulse 100 respiratory 23 blood pressure 136/75 saturations 99% ? ?Ventilator Settings on pressure support FiO2 is 28% pressure 12/5 ? ?General: Comfortable at this time ?Neck: supple ?Cardiovascular: no malignant arrhythmias ?Respiratory: Scattered rhonchi very coarse breath sounds ?Skin: no rash seen on limited exam ?Musculoskeletal: No gross abnormality ?Psychiatric:unable to assess ?Neurologic:no involuntary movements   ?   ?   ?Lab Data:  ? ?Basic Metabolic Panel: ?Recent Labs  ?Lab 12/02/21 ?0218 12/03/21 ?8119 12/03/21 ?2015 12/04/21 ?0403 12/04/21 ?1243 12/05/21 ?1478 12/06/21 ?2956  ?NA 137 142  --  139  --   --  134*  ?K 4.2 3.7  --  3.8  --   --  3.7  ?CL 101 103  --  103  --   --  101  ?CO2 27 27  --  27  --   --  26  ?GLUCOSE 115* 113*  --  92  --   --  123*  ?BUN 19 16  --  10  --   --  14  ?CREATININE 0.85 0.78  --  0.65  --   --  0.76  ?CALCIUM 8.3* 8.4*  --  8.5*  --   --  7.9*  ?MG 1.9 1.4* 1.6* 2.0 1.5* 1.7 1.7  ?PHOS  --   --   --  4.5  --   --   --   ? ? ?ABG: ?Recent Labs  ?Lab 12/01/21 ?1943 12/02/21 ?0139 12/02/21 ?2130 12/03/21 ?1500  ?PHART 7.46* 7.36 7.26* 7.48*  ?PCO2ART 44 50* 63* 43  ?PO2ART 132* 89 282* 148*  ?HCO3 31.3* 27.7 28.3* 32.0*  ?O2SAT 99.2 97.1 98.8 98.4  ? ? ?Liver Function Tests: ?Recent Labs  ?Lab 12/02/21 ?0218 12/03/21 ?8657 12/04/21 ?0403  ?AST 15 14*  --   ?ALT 6 6  --   ?ALKPHOS 305* 243*  --   ?BILITOT 0.3 0.3  --   ?PROT 6.8 6.5  --    ?ALBUMIN 1.6* 1.5* 1.7*  ? ?No results for input(s): LIPASE, AMYLASE in the last 168 hours. ?Recent Labs  ?Lab 12/03/21 ?1450  ?AMMONIA 35  ? ? ?CBC: ?Recent Labs  ?Lab 12/02/21 ?0218 12/03/21 ?8469 12/03/21 ?1450 12/04/21 ?0403 12/06/21 ?6295  ?WBC 11.2* 8.1 8.9 7.6 11.5*  ?NEUTROABS 8.7*  --   --   --   --   ?HGB 7.2* 6.4* 7.3* 8.0* 7.9*  ?HCT 22.0* 20.3* 22.4* 25.4* 24.1*  ?MCV 93.2 94.4 92.2 93.4 92.7  ?PLT 449* 450* 422* 432* 435*  ? ? ?Cardiac Enzymes: ?Recent Labs  ?Lab 12/02/21 ?0218  ?CKTOTAL 28*  ? ? ?BNP (last 3 results) ?Recent Labs  ?  12/02/21 ?0736  ?BNP 239.5*  ? ? ?ProBNP (last 3 results) ?No results for input(s): PROBNP in the last 8760 hours. ? ?Radiological Exams: ?IR Replc Gastro/Colonic Tube Percut W/Fluoro ? ?Result Date: 12/05/2021 ?INDICATION: Patient history acute and chronic respiratory failure failure. On ventilator support with gastrostomy tube for nutritional purposes. Request is for  gastrostomy tube replacement. EXAM: GASTROSTOMY TUBE REPLACEMENT COMPARISON:  None MEDICATIONS: No; Antibiotics were administered within 1 hour of the procedure. CONTRAST:  10 mL of Isovue 300 administered into the gastric lumen. ANESTHESIA/SEDATION: Moderate (conscious) sedation was not employed during this procedure FLUOROSCOPY TIME:  (1 mGy) COMPLICATIONS: None immediate. PROCEDURE: Informed written consent was obtained from the patient and/or patient's representative following explanation of the procedure, risks, benefits and alternatives. A time out was performed prior to the initiation of the procedure. Maximal barrier sterile technique utilized including caps, mask, sterile gowns, sterile gloves, and hand hygiene. The LEFT upper quadrant was sterilely prepped and draped. Under intermittent fluoroscopic guidance, the place holder catheter was exchanged for a Amplatz, ultimately allowing placement of a 18 Fr balloon retention gastrostomy tube. The retention balloon was insufflated with a mixture of  dilute saline and pulled taut against the anterior wall of the stomach. The external disc was cinched. Contrast injection confirms positioning within the stomach. Spot radiographic images confirmed placement within the stomach. The patient tolerated procedure well without immediate post procedural complication. FINDINGS: After successful fluoroscopic guided replacement, the gastrostomy tube is appropriately positioned with internal retention balloon against the ventral aspect of the gastric lumen. IMPRESSION: Successful fluoroscopic replacement for a 18 Fr balloon- retention gastrostomy tube, as above. The gastrostomy tube is available to use at this time. Read by  Rushie Nyhan, IR NP Electronically Signed   By: Michaelle Birks M.D.   On: 12/05/2021 16:07  ? ?DG Chest Port 1 View ? ?Result Date: 12/06/2021 ?CLINICAL DATA:  Shortness of breath and congestion, history of pneumonia EXAM: PORTABLE CHEST 1 VIEW COMPARISON:  Radiograph 12/03/2021, chest CT 12/04/2021 FINDINGS: Tracheostomy tube overlies the mid trachea. Unchanged cardiomediastinal silhouette. Unchanged right neck catheter with tip overlying the right atrium. There is persistent bilateral airspace disease, left greater than right, increased in the left upper lung. Stable small bilateral pleural effusions. No pneumothorax. No acute osseous abnormality. IMPRESSION: Persistent bilateral airspace disease, increased in the left upper lung. Stable small bilateral pleural effusions. Electronically Signed   By: Maurine Simmering M.D.   On: 12/06/2021 14:55   ? ?Assessment/Plan ?Active Problems: ?  Acute on chronic respiratory failure with hypoxia (HCC) ?  Spinal epidural abscess ?  Alcohol withdrawal delirium ([place]) ?  Acute renal failure due to tubular necrosis (HCC) ?  Severe sepsis ([place]) ? ? ?Acute on chronic respiratory failure hypoxia plan is to continue with the pressure support 12/5 goal of 8 hours at this time ?Spinal epidural abscess no change we will continue  to follow along closely ?Alcohol withdrawal no change ?Acute renal failure supportive care ?Severe sepsis treated resolved ? ? ?I have personally seen and evaluated the patient, evaluated laboratory and imaging results, formulated the assessment and plan and placed orders. ?The Patient requires high complexity decision making with multiple systems involvement.  ?Rounds were done with the Respiratory Therapy Director and Staff therapists and discussed with nursing staff also. ? ?Allyne Gee, MD FCCP ?Pulmonary Critical Care Medicine ?Sleep Medicine ? ?

## 2021-12-07 DIAGNOSIS — F10931 Alcohol use, unspecified with withdrawal delirium: Secondary | ICD-10-CM | POA: Diagnosis not present

## 2021-12-07 DIAGNOSIS — J9621 Acute and chronic respiratory failure with hypoxia: Secondary | ICD-10-CM | POA: Diagnosis not present

## 2021-12-07 DIAGNOSIS — N170 Acute kidney failure with tubular necrosis: Secondary | ICD-10-CM | POA: Diagnosis not present

## 2021-12-07 DIAGNOSIS — A419 Sepsis, unspecified organism: Secondary | ICD-10-CM | POA: Diagnosis not present

## 2021-12-07 NOTE — Consult Note (Signed)
?          Infectious Disease Consultation ? ? ?Paul Mckenzie  ENI:[id number]  DOB: 1971/02/25  DOA: 12/01/2021 ? ?Requesting physician: ?Dr. Owens Shark ? ?Reason for consultation: ?Antibiotic recommendations  ? ?History of Present Illness: ?Paul Mckenzie is an 51 y.o. male medical history significant of alcohol dependence, epilepsy on Lamictal, GERD, hypertension, depression.  He was admitted to [facility] on 10/24/2021 with altered mental status when he was brought to the ED by his wife.  He had reportedly fallen multiple times over several days.  Patient mental status progressively declined over several days along with visual hallucinations.  Critical care was consulted for alcohol withdrawal.  He was also noted to have hyponatremia, rhabdomyolysis, acute kidney injury.  Chest x-ray showed multifocal pneumonia.  He required increased oxygen associated with high fever.  On 10/29/2021 he was noted to have worsening respiratory status and subsequently had to be intubated.  He also had worsening mental status.  His blood cultures were positive for MSSA.  CT of the head did not show any acute findings.  MRI of the brain/spine showed epidural abscess from C2-L1 with moderate spinal canal stenosis and concern for meningitis. Also noted to have cervical discitis C5-C7 with septic facet joint.  Ortho/spine was consulted.  No surgical intervention due to the size of the abscess.  Worsening renal function was noted on 11/01/2021.  Trialysis placed and was initiated on CRRT.  On 11/08/2021 there were concerns for endocarditis.  TTE did not show any obvious vegetation.  Duplex of all extremities was negative for DVT.  He underwent trach/PEG placement on 11/11/2021.  Patient received treatment with multiple antibiotics including Zosyn cefepime, Vesselon, vancomycin.  PermCath inserted on 11/14/2021 and he was started on dialysis.  Foley catheter was removed on 11/15/2021.  Patient was producing urine.   Apparently Foley was reinserted at some point and then removed again on 11/21/2021.  His hemoglobin trended down.  He was given transfusion and a 4.  Repeat chest x-ray on 11/25/2021 did not show any acute interval changes.  On 11/28/2021 he was able to tolerate trach collar, return to vent to rest.  Due to his complex medical problems he was transferred and admitted to [facility] for further management.  He received the following antibiotics at the acute facility: ?Drug: Start Date: End date: Duration/Other:  ?Oxacillin 10/30/2021 11/22/2021  ?Cefepime 10/26/2021-11/03/2021. Resume 11/11/2021 11/13/2021  ?Vancomycin 10/26/2021 11/02/2021  ?Zosyn 11/09/2021 11/11/2021  ?Cefazolin 11/22/2021  ?After presentation here he had a rapid response and CODE BLUE called on 12/02/2021, ACLS to [place].  He is on vent support. ? ?Review of Systems:  ?As per HPI otherwise 10 point review of systems negative.  ? ?Past Medical History: ? Anxiety  ? Depression  ? Hypertension  ? Seizures ([place])  ?Alcohol abuse, GERD, acute renal failure status postdialysis, rhabdomyolysis, sepsis with MSSA bacteremia, epidural abscess with meningitis, closed fracture of the lower end of the left tibia with routine healing ? ?Past Surgical History: ?No pertinent surgical history ? ?Allergies: ?Azithromycin ? ?Social History: ? Smokeless tobacco: Current  ?Types: Snuff  ?Substance Use Topics  ? Alcohol use: Yes  ?Comment: 12 pack of white claw daily  ? ? ?Family History: ?Noncontributory to the present illness ? ?Physical Exam: ?Vitals: Temperature 98.8, pulse 92, respiratory rate 28, blood pressure 152/88, oxygen saturation 97% ? ?Constitutional: Awake, on the vent ?Head: Normocephalic, pressure injury of the occiput, unstageable ?Eyes: PERLA, EOMI ?ENMT: external  ears and nose appear normal, normal hearing, moist oral mucosa ?Neck: supple  ?CVS: S1-S2   ?Respiratory: Decreased breath sound lower lobes, scattered rhonchi, no wheezing ?Abdomen: soft  nontender, nondistended, normal bowel sounds, PEG tube in place ?Musculoskeletal: No edema ?Neuro: Awake, following commands, has decreased grip strength, decreased strength in the lower extremities left greater than right ?Psych: stable mood and affect, mental status ?Skin: no rashes, pressure injury of the occiput, deep tissue pressure injury of the sacrum (please see wound care documentation and pictures) ? ?Data reviewed:  I have personally reviewed following labs and imaging studies ?Labs:  ?CBC: ?Recent Labs  ?Lab 12/02/21 ?0218 12/03/21 ?9937 12/03/21 ?1450 12/04/21 ?0403 12/06/21 ?1696  ?WBC 11.2* 8.1 8.9 7.6 11.5*  ?NEUTROABS 8.7*  --   --   --   --   ?HGB 7.2* 6.4* 7.3* 8.0* 7.9*  ?HCT 22.0* 20.3* 22.4* 25.4* 24.1*  ?MCV 93.2 94.4 92.2 93.4 92.7  ?PLT 449* 450* 422* 432* 435*  ? ? ?Basic Metabolic Panel: ?Recent Labs  ?Lab 12/02/21 ?0218 12/03/21 ?7893 12/03/21 ?2015 12/04/21 ?0403 12/04/21 ?1243 12/05/21 ?8101 12/06/21 ?7510  ?NA 137 142  --  139  --   --  134*  ?K 4.2 3.7  --  3.8  --   --  3.7  ?CL 101 103  --  103  --   --  101  ?CO2 27 27  --  27  --   --  26  ?GLUCOSE 115* 113*  --  92  --   --  123*  ?BUN 19 16  --  10  --   --  14  ?CREATININE 0.85 0.78  --  0.65  --   --  0.76  ?CALCIUM 8.3* 8.4*  --  8.5*  --   --  7.9*  ?MG 1.9 1.4* 1.6* 2.0 1.5* 1.7 1.7  ?PHOS  --   --   --  4.5  --   --   --   ? ?GFR ?CrCl cannot be calculated (Unknown ideal weight.). ?Liver Function Tests: ?Recent Labs  ?Lab 12/02/21 ?0218 12/03/21 ?2585 12/04/21 ?0403  ?AST 15 14*  --   ?ALT 6 6  --   ?ALKPHOS 305* 243*  --   ?BILITOT 0.3 0.3  --   ?PROT 6.8 6.5  --   ?ALBUMIN 1.6* 1.5* 1.7*  ? ?No results for input(s): LIPASE, AMYLASE in the last 168 hours. ?Recent Labs  ?Lab 12/03/21 ?1450  ?AMMONIA 35  ? ?Coagulation profile ?Recent Labs  ?Lab 12/02/21 ?0736  ?INR 1.7*  ? ? ?Cardiac Enzymes: ?Recent Labs  ?Lab 12/02/21 ?0218  ?CKTOTAL 28*  ? ?BNP: ?Invalid input(s): POCBNP ?CBG: ?No results for input(s): GLUCAP in the  last 168 hours. ?D-Dimer ?No results for input(s): DDIMER in the last 72 hours. ?Hgb A1c ?No results for input(s): HGBA1C in the last 72 hours. ?Lipid Profile ?No results for input(s): CHOL, HDL, LDLCALC, TRIG, CHOLHDL, LDLDIRECT in the last 72 hours. ?Thyroid function studies ?No results for input(s): TSH, T4TOTAL, T3FREE, THYROIDAB in the last 72 hours. ? ?Invalid input(s): FREET3 ?Anemia work up ?No results for input(s): VITAMINB12, FOLATE, FERRITIN, TIBC, IRON, RETICCTPCT in the last 72 hours. ?Urinalysis ?   ?Component Value Date/Time  ? [place] YELLOW 12/06/2021 2017  ? APPEARANCEUR HAZY (A) 12/06/2021 2017  ? LABSPEC 1.020 12/06/2021 2017  ? PHURINE 5.0 12/06/2021 2017  ? GLUCOSEU NEGATIVE 12/06/2021 2017  ? HGBUR LARGE (A) 12/06/2021 2017  ? BILIRUBINUR NEGATIVE  12/06/2021 2017  ? [place] NEGATIVE 12/06/2021 2017  ? PROTEINUR 30 (A) 12/06/2021 2017  ? NITRITE NEGATIVE 12/06/2021 2017  ? LEUKOCYTESUR SMALL (A) 12/06/2021 2017  ? ?Sepsis Labs ?Invalid input(s): PROCALCITONIN,  WBC,  LACTICIDVEN ?Microbiology ?Recent Results (from the past 240 hour(s))  ?Urine Culture     Status: None  ? Collection Time: 12/03/21 12:08 PM  ? Specimen: Urine, Clean Catch  ?Result Value Ref Range Status  ? Specimen Description URINE, CLEAN CATCH  Final  ? Special Requests NONE  Final  ? Culture   Final  ?  NO GROWTH ?Performed at [facility], [place] [address], [place], [place] [address] ?  ? Report Status 12/04/2021 FINAL  Final  ?Culture, Respiratory w Gram Stain     Status: None (Preliminary result)  ? Collection Time: 12/06/21  2:07 PM  ? Specimen: Tracheal Aspirate; Respiratory  ?Result Value Ref Range Status  ? Specimen Description TRACHEAL ASPIRATE  Final  ? Special Requests NONE  Final  ? Gram Stain   Final  ?  NO SQUAMOUS EPITHELIAL CELLS SEEN ?FEW WBC SEEN ?MODERATE GRAM NEGATIVE RODS ?  ? Culture   Final  ?  TOO YOUNG TO READ ?Performed at [facility], [place] [address] [place], [place] [address] ?  ?  Report Status PENDING  Incomplete  ?Culture, blood (routine x 2)     Status: None (Preliminary result)  ? Collection Time: 12/06/21  2:35 PM  ? Specimen: BLOOD LEFT HAND  ?Result Value Ref Range Status  ? Specimen Desc

## 2021-12-07 NOTE — Progress Notes (Addendum)
Pulmonary Critical Care Medicine ?[place] ?  ?PULMONARY CRITICAL CARE SERVICE ? ?PROGRESS NOTE ? ? ? ? ?Paul Mckenzie  ?OPF:[id number]  ?DOB: 1970-12-05  ? ?DOA: 12/01/2021 ? ?Referring Physician: Satira Sark, MD ? ?HPI: Paul Mckenzie is a 51 y.o. male being followed for ventilator/airway/oxygen weaning Acute on Chronic Respiratory Failure.  Patient seen lying in bed, family at bedside.  Currently on pressure support 12/5.  No acute distress, no acute overnight events ? ?Medications: ?Reviewed on Rounds ? ?Physical Exam: ? ?Vitals: Temp 98.4, pulse 98, respirations 22, BP 134/56, SPO2 99% ? ?Ventilator Settings pressure support 12/5, 28%. ? ?General: Comfortable at this time ?Neck: supple ?Cardiovascular: no malignant arrhythmias ?Respiratory: Bilaterally clear in both lobes, left lobe noted to be more distant than right ?Skin: no rash seen on limited exam ?Musculoskeletal: No gross abnormality ?Psychiatric:unable to assess ?Neurologic:no involuntary movements   ?   ?   ?Lab Data:  ? ?Basic Metabolic Panel: ?Recent Labs  ?Lab 12/02/21 ?0218 12/03/21 ?3817 12/03/21 ?2015 12/04/21 ?0403 12/04/21 ?1243 12/05/21 ?7116 12/06/21 ?5790  ?NA 137 142  --  139  --   --  134*  ?K 4.2 3.7  --  3.8  --   --  3.7  ?CL 101 103  --  103  --   --  101  ?CO2 27 27  --  27  --   --  26  ?GLUCOSE 115* 113*  --  92  --   --  123*  ?BUN 19 16  --  10  --   --  14  ?CREATININE 0.85 0.78  --  0.65  --   --  0.76  ?CALCIUM 8.3* 8.4*  --  8.5*  --   --  7.9*  ?MG 1.9 1.4* 1.6* 2.0 1.5* 1.7 1.7  ?PHOS  --   --   --  4.5  --   --   --   ? ? ?ABG: ?Recent Labs  ?Lab 12/01/21 ?1943 12/02/21 ?0139 12/02/21 ?3833 12/03/21 ?1500  ?PHART 7.46* 7.36 7.26* 7.48*  ?PCO2ART 44 50* 63* 43  ?PO2ART 132* 89 282* 148*  ?HCO3 31.3* 27.7 28.3* 32.0*  ?O2SAT 99.2 97.1 98.8 98.4  ? ? ?Liver Function Tests: ?Recent Labs  ?Lab 12/02/21 ?0218 12/03/21 ?3832 12/04/21 ?0403  ?AST 15 14*  --   ?ALT 6 6  --   ?ALKPHOS 305* 243*   --   ?BILITOT 0.3 0.3  --   ?PROT 6.8 6.5  --   ?ALBUMIN 1.6* 1.5* 1.7*  ? ?No results for input(s): LIPASE, AMYLASE in the last 168 hours. ?Recent Labs  ?Lab 12/03/21 ?1450  ?AMMONIA 35  ? ? ?CBC: ?Recent Labs  ?Lab 12/02/21 ?0218 12/03/21 ?9191 12/03/21 ?1450 12/04/21 ?0403 12/06/21 ?6606  ?WBC 11.2* 8.1 8.9 7.6 11.5*  ?NEUTROABS 8.7*  --   --   --   --   ?HGB 7.2* 6.4* 7.3* 8.0* 7.9*  ?HCT 22.0* 20.3* 22.4* 25.4* 24.1*  ?MCV 93.2 94.4 92.2 93.4 92.7  ?PLT 449* 450* 422* 432* 435*  ? ? ?Cardiac Enzymes: ?Recent Labs  ?Lab 12/02/21 ?0218  ?CKTOTAL 28*  ? ? ?BNP (last 3 results) ?Recent Labs  ?  12/02/21 ?0736  ?BNP 239.5*  ? ? ?ProBNP (last 3 results) ?No results for input(s): PROBNP in the last 8760 hours. ? ?Radiological Exams: ?IR Replc Gastro/Colonic Tube Percut W/Fluoro ? ?Result Date: 12/05/2021 ?INDICATION: Patient history acute and chronic respiratory failure failure. On  ventilator support with gastrostomy tube for nutritional purposes. Request is for gastrostomy tube replacement. EXAM: GASTROSTOMY TUBE REPLACEMENT COMPARISON:  None MEDICATIONS: No; Antibiotics were administered within 1 hour of the procedure. CONTRAST:  10 mL of Isovue 300 administered into the gastric lumen. ANESTHESIA/SEDATION: Moderate (conscious) sedation was not employed during this procedure FLUOROSCOPY TIME:  (1 mGy) COMPLICATIONS: None immediate. PROCEDURE: Informed written consent was obtained from the patient and/or patient's representative following explanation of the procedure, risks, benefits and alternatives. A time out was performed prior to the initiation of the procedure. Maximal barrier sterile technique utilized including caps, mask, sterile gowns, sterile gloves, and hand hygiene. The LEFT upper quadrant was sterilely prepped and draped. Under intermittent fluoroscopic guidance, the place holder catheter was exchanged for a Amplatz, ultimately allowing placement of a 18 Fr balloon retention gastrostomy tube. The  retention balloon was insufflated with a mixture of dilute saline and pulled taut against the anterior wall of the stomach. The external disc was cinched. Contrast injection confirms positioning within the stomach. Spot radiographic images confirmed placement within the stomach. The patient tolerated procedure well without immediate post procedural complication. FINDINGS: After successful fluoroscopic guided replacement, the gastrostomy tube is appropriately positioned with internal retention balloon against the ventral aspect of the gastric lumen. IMPRESSION: Successful fluoroscopic replacement for a 18 Fr balloon- retention gastrostomy tube, as above. The gastrostomy tube is available to use at this time. Read by  Rushie Nyhan, IR NP Electronically Signed   By: Michaelle Birks M.D.   On: 12/05/2021 16:07  ? ?DG Chest Port 1 View ? ?Result Date: 12/06/2021 ?CLINICAL DATA:  Shortness of breath and congestion, history of pneumonia EXAM: PORTABLE CHEST 1 VIEW COMPARISON:  Radiograph 12/03/2021, chest CT 12/04/2021 FINDINGS: Tracheostomy tube overlies the mid trachea. Unchanged cardiomediastinal silhouette. Unchanged right neck catheter with tip overlying the right atrium. There is persistent bilateral airspace disease, left greater than right, increased in the left upper lung. Stable small bilateral pleural effusions. No pneumothorax. No acute osseous abnormality. IMPRESSION: Persistent bilateral airspace disease, increased in the left upper lung. Stable small bilateral pleural effusions. Electronically Signed   By: Maurine Simmering M.D.   On: 12/06/2021 14:55   ? ?Assessment/Plan ?Active Problems: ?  Acute on chronic respiratory failure with hypoxia (HCC) ?  Spinal epidural abscess ?  Alcohol withdrawal delirium ([place]) ?  Acute renal failure due to tubular necrosis (HCC) ?  Severe sepsis ([place]) ? ? ?Acute on chronic respiratory failure with hypoxia-plan is to continue with pressure support 12/5, today's goal is 12 hours.   Completed 8-hour goal yesterday. ?Spinal epidural abscess-no change noted.  Continue to follow along and monitor CBC ?Alcohol withdrawal-currently no change, continue with supportive care ?Acute renal failure-continue with supportive care, strict I's and O's ?Severe sepsis-has been treated and has currently resolved.  Patient remains hemodynamically stable ? ? ?I have personally seen and evaluated the patient, evaluated laboratory and imaging results, formulated the assessment and plan and placed orders. ?The Patient requires high complexity decision making with multiple systems involvement.  ?Rounds were done with the Respiratory Therapy Director and Staff therapists and discussed with nursing staff also. ? ?Allyne Gee, MD FCCP ?Pulmonary Critical Care Medicine ?Sleep Medicine  ?

## 2021-12-08 ENCOUNTER — Other Ambulatory Visit (HOSPITAL_COMMUNITY): Payer: Self-pay

## 2021-12-08 DIAGNOSIS — A419 Sepsis, unspecified organism: Secondary | ICD-10-CM | POA: Diagnosis not present

## 2021-12-08 DIAGNOSIS — N170 Acute kidney failure with tubular necrosis: Secondary | ICD-10-CM | POA: Diagnosis not present

## 2021-12-08 DIAGNOSIS — J9621 Acute and chronic respiratory failure with hypoxia: Secondary | ICD-10-CM | POA: Diagnosis not present

## 2021-12-08 DIAGNOSIS — F10931 Alcohol use, unspecified with withdrawal delirium: Secondary | ICD-10-CM | POA: Diagnosis not present

## 2021-12-08 HISTORY — PX: IR REMOVAL TUN CV CATH W/O FL: IMG2289

## 2021-12-08 LAB — URINE CULTURE: Culture: <10000 — AB

## 2021-12-08 MED ORDER — LIDOCAINE HCL 1 % IJ SOLN
INTRAMUSCULAR | Status: AC
  Administered 2021-12-08: 10 mL
  Filled 2021-12-08: qty 20

## 2021-12-08 MED ORDER — CHLORHEXIDINE GLUCONATE 4 % EX LIQD
CUTANEOUS | Status: AC
  Filled 2021-12-08: qty 15

## 2021-12-08 NOTE — Progress Notes (Addendum)
Pulmonary Critical Care Medicine ?[place] ?  ?PULMONARY CRITICAL CARE SERVICE ? ?PROGRESS NOTE ? ? ? ? ?Paul Mckenzie  ?VFI:[id number]  ?DOB: 03/22/1971  ? ?DOA: 12/01/2021 ? ?Referring Physician: Satira Sark, MD ? ?HPI: Paul Mckenzie is a 51 y.o. male being followed for ventilator/airway/oxygen weaning Acute on Chronic Respiratory Failure.  Patient seen sitting up in bed, family at bedside.  Currently on pressure support 4 5.  He did complete his goal of 12 hours pressure support yesterday.  Continues to tolerate weaning appropriately.  No acute distress, no acute overnight events. ? ?Medications: ?Reviewed on Rounds ? ?Physical Exam: ? ?Vitals: Temp 99.0, pulse 96, respirations 22, BP 150/84, SPO2 97% ? ?Ventilator Settings pressure support 12/5 28% ? ?General: Comfortable at this time ?Neck: supple ?Cardiovascular: no malignant arrhythmias ?Respiratory: Bilaterally clear post cough ?Skin: no rash seen on limited exam ?Musculoskeletal: No gross abnormality ?Psychiatric:unable to assess ?Neurologic:no involuntary movements   ?   ?   ?Lab Data:  ? ?Basic Metabolic Panel: ?Recent Labs  ?Lab 12/02/21 ?0218 12/03/21 ?4166 12/03/21 ?2015 12/04/21 ?0403 12/04/21 ?1243 12/05/21 ?0630 12/06/21 ?1601  ?NA 137 142  --  139  --   --  134*  ?K 4.2 3.7  --  3.8  --   --  3.7  ?CL 101 103  --  103  --   --  101  ?CO2 27 27  --  27  --   --  26  ?GLUCOSE 115* 113*  --  92  --   --  123*  ?BUN 19 16  --  10  --   --  14  ?CREATININE 0.85 0.78  --  0.65  --   --  0.76  ?CALCIUM 8.3* 8.4*  --  8.5*  --   --  7.9*  ?MG 1.9 1.4* 1.6* 2.0 1.5* 1.7 1.7  ?PHOS  --   --   --  4.5  --   --   --   ? ? ?ABG: ?Recent Labs  ?Lab 12/01/21 ?1943 12/02/21 ?0139 12/02/21 ?0932 12/03/21 ?1500  ?PHART 7.46* 7.36 7.26* 7.48*  ?PCO2ART 44 50* 63* 43  ?PO2ART 132* 89 282* 148*  ?HCO3 31.3* 27.7 28.3* 32.0*  ?O2SAT 99.2 97.1 98.8 98.4  ? ? ?Liver Function Tests: ?Recent Labs  ?Lab 12/02/21 ?0218 12/03/21 ?3557  12/04/21 ?0403  ?AST 15 14*  --   ?ALT 6 6  --   ?ALKPHOS 305* 243*  --   ?BILITOT 0.3 0.3  --   ?PROT 6.8 6.5  --   ?ALBUMIN 1.6* 1.5* 1.7*  ? ?No results for input(s): LIPASE, AMYLASE in the last 168 hours. ?Recent Labs  ?Lab 12/03/21 ?1450  ?AMMONIA 35  ? ? ?CBC: ?Recent Labs  ?Lab 12/02/21 ?0218 12/03/21 ?3220 12/03/21 ?1450 12/04/21 ?0403 12/06/21 ?2542  ?WBC 11.2* 8.1 8.9 7.6 11.5*  ?NEUTROABS 8.7*  --   --   --   --   ?HGB 7.2* 6.4* 7.3* 8.0* 7.9*  ?HCT 22.0* 20.3* 22.4* 25.4* 24.1*  ?MCV 93.2 94.4 92.2 93.4 92.7  ?PLT 449* 450* 422* 432* 435*  ? ? ?Cardiac Enzymes: ?Recent Labs  ?Lab 12/02/21 ?0218  ?CKTOTAL 28*  ? ? ?BNP (last 3 results) ?Recent Labs  ?  12/02/21 ?0736  ?BNP 239.5*  ? ? ?ProBNP (last 3 results) ?No results for input(s): PROBNP in the last 8760 hours. ? ?Radiological Exams: ?DG Chest Port 1 View ? ?Result Date: 12/06/2021 ?CLINICAL  DATA:  Shortness of breath and congestion, history of pneumonia EXAM: PORTABLE CHEST 1 VIEW COMPARISON:  Radiograph 12/03/2021, chest CT 12/04/2021 FINDINGS: Tracheostomy tube overlies the mid trachea. Unchanged cardiomediastinal silhouette. Unchanged right neck catheter with tip overlying the right atrium. There is persistent bilateral airspace disease, left greater than right, increased in the left upper lung. Stable small bilateral pleural effusions. No pneumothorax. No acute osseous abnormality. IMPRESSION: Persistent bilateral airspace disease, increased in the left upper lung. Stable small bilateral pleural effusions. Electronically Signed   By: Maurine Simmering M.D.   On: 12/06/2021 14:55   ? ?Assessment/Plan ?Active Problems: ?  Acute on chronic respiratory failure with hypoxia (HCC) ?  Spinal epidural abscess ?  Alcohol withdrawal delirium ([place]) ?  Acute renal failure due to tubular necrosis (HCC) ?  Severe sepsis ([place]) ? ? ?Acute on chronic respiratory failure with hypoxia-plan is to continue pressure support 12/5 today.  Patient completed 12-hour goal  yesterday.  Today's goal is 16. ?Spinal epidural abscess-no change overall noted.  Infectious disease is following case.  Continue to monitor CBC. ?Alcohol withdrawal-currently no obvious change, continue with supportive care. ?Acute renal failure-continue with supportive care, strict I's and O's. ?Severe sepsis-has been treated and currently resolved.  Patient remains hemodynamically stable. ? ? ?I have personally seen and evaluated the patient, evaluated laboratory and imaging results, formulated the assessment and plan and placed orders. ?The Patient requires high complexity decision making with multiple systems involvement.  ?Rounds were done with the Respiratory Therapy Director and Staff therapists and discussed with nursing staff also. ? ?Allyne Gee, MD FCCP ?Pulmonary Critical Care Medicine ?Sleep Medicine  ?

## 2021-12-09 DIAGNOSIS — F10931 Alcohol use, unspecified with withdrawal delirium: Secondary | ICD-10-CM | POA: Diagnosis not present

## 2021-12-09 DIAGNOSIS — A419 Sepsis, unspecified organism: Secondary | ICD-10-CM | POA: Diagnosis not present

## 2021-12-09 DIAGNOSIS — J9621 Acute and chronic respiratory failure with hypoxia: Secondary | ICD-10-CM | POA: Diagnosis not present

## 2021-12-09 DIAGNOSIS — N170 Acute kidney failure with tubular necrosis: Secondary | ICD-10-CM | POA: Diagnosis not present

## 2021-12-09 LAB — BASIC METABOLIC PANEL
Anion gap: 6 (ref 5–15)
BUN: 14 mg/dL (ref 6–20)
CO2: 26 mmol/L (ref 22–32)
Calcium: 8.3 mg/dL — ABNORMAL LOW (ref 8.9–10.3)
Chloride: 103 mmol/L (ref 98–111)
Creatinine, Ser: 0.51 mg/dL — ABNORMAL LOW (ref 0.61–1.24)
GFR, Estimated: >60 mL/min (ref >60)
Glucose, Bld: 144 mg/dL — ABNORMAL HIGH (ref 70–99)
Potassium: 6 mmol/L — ABNORMAL HIGH (ref 3.5–5.1)
Sodium: 135 mmol/L (ref 135–145)

## 2021-12-09 LAB — MAGNESIUM
Magnesium: 1.3 mg/dL — ABNORMAL LOW (ref 1.7–2.4)
Magnesium: 1.9 mg/dL (ref 1.7–2.4)
Magnesium: 1.7 mg/dL (ref 1.7–2.4)

## 2021-12-09 LAB — CBC
HCT: 22.8 % — ABNORMAL LOW (ref 39.0–52.0)
Hemoglobin: 7.1 g/dL — ABNORMAL LOW (ref 13.0–17.0)
MCH: 29.3 pg (ref 26.0–34.0)
MCHC: 31.1 g/dL (ref 30.0–36.0)
MCV: 94.2 fL (ref 80.0–100.0)
Platelets: 270 10*3/uL (ref 150–400)
RBC: 2.42 MIL/uL — ABNORMAL LOW (ref 4.22–5.81)
RDW: 14.5 % (ref 11.5–15.5)
WBC: 5.2 10*3/uL (ref 4.0–10.5)
nRBC: 0 % (ref 0.0–0.2)

## 2021-12-09 LAB — OCCULT BLOOD X 1 CARD TO LAB, STOOL: Fecal Occult Bld: NEGATIVE

## 2021-12-09 LAB — POTASSIUM: Potassium: 4.6 mmol/L (ref 3.5–5.1)

## 2021-12-09 NOTE — Progress Notes (Addendum)
Pulmonary Critical Care Medicine ?[place] ?  ?PULMONARY CRITICAL CARE SERVICE ? ?PROGRESS NOTE ? ? ? ? ?Gillian Shields Schoenbeck  ?GNF:[id number]  ?DOB: Jul 18, 1971  ? ?DOA: 12/01/2021 ? ?Referring Physician: Satira Sark, MD ? ?HPI: Muzamil Harker is a 51 y.o. male being followed for ventilator/airway/oxygen weaning Acute on Chronic Respiratory Failure.  Patient seen sitting up in bed, wife at bedside.  Patient met goal yesterday.  Will attempt ATC 2 hours today.  Continues to have increased secretions but a good productive cough.  There is some sputum coming around his trach into his oral cavity.  No acute distress, no acute overnight events. ? ?Medications: ?Reviewed on Rounds ? ?Physical Exam: ? ?Vitals: Temp 97.4, pulse 85, respirations 38, BP 147/101, SPO2 99% ? ?Ventilator Settings pressure support 12/5 ? ?General: Comfortable at this time ?Neck: supple ?Cardiovascular: no malignant arrhythmias ?Respiratory: Bilaterally clear in all lobes ?Skin: no rash seen on limited exam ?Musculoskeletal: No gross abnormality ?Psychiatric:unable to assess ?Neurologic:no involuntary movements   ?   ?   ?Lab Data:  ? ?Basic Metabolic Panel: ?Recent Labs  ?Lab 12/03/21 ?8469 12/03/21 ?2015 12/04/21 ?0403 12/04/21 ?1243 12/05/21 ?6295 12/06/21 ?2841 12/09/21 ?3244  ?NA 142  --  139  --   --  134* 135  ?K 3.7  --  3.8  --   --  3.7 6.0*  ?CL 103  --  103  --   --  101 103  ?CO2 27  --  27  --   --  26 26  ?GLUCOSE 113*  --  92  --   --  123* 144*  ?BUN 16  --  10  --   --  14 14  ?CREATININE 0.78  --  0.65  --   --  0.76 0.51*  ?CALCIUM 8.4*  --  8.5*  --   --  7.9* 8.3*  ?MG 1.4*   < > 2.0 1.5* 1.7 1.7 1.3*  ?PHOS  --   --  4.5  --   --   --   --   ? < > = values in this interval not displayed.  ? ? ?ABG: ?Recent Labs  ?Lab 12/03/21 ?1500  ?PHART 7.48*  ?PCO2ART 43  ?PO2ART 148*  ?HCO3 32.0*  ?O2SAT 98.4  ? ? ?Liver Function Tests: ?Recent Labs  ?Lab 12/03/21 ?0102 12/04/21 ?0403  ?AST 14*  --   ?ALT  6  --   ?ALKPHOS 243*  --   ?BILITOT 0.3  --   ?PROT 6.5  --   ?ALBUMIN 1.5* 1.7*  ? ?No results for input(s): LIPASE, AMYLASE in the last 168 hours. ?Recent Labs  ?Lab 12/03/21 ?1450  ?AMMONIA 35  ? ? ?CBC: ?Recent Labs  ?Lab 12/03/21 ?0613 12/03/21 ?1450 12/04/21 ?0403 12/06/21 ?7253 12/09/21 ?6644  ?WBC 8.1 8.9 7.6 11.5* 5.2  ?HGB 6.4* 7.3* 8.0* 7.9* 7.1*  ?HCT 20.3* 22.4* 25.4* 24.1* 22.8*  ?MCV 94.4 92.2 93.4 92.7 94.2  ?PLT 450* 422* 432* 435* 270  ? ? ?Cardiac Enzymes: ?No results for input(s): CKTOTAL, CKMB, CKMBINDEX, TROPONINI in the last 168 hours. ? ?BNP (last 3 results) ?Recent Labs  ?  12/02/21 ?0736  ?BNP 239.5*  ? ? ?ProBNP (last 3 results) ?No results for input(s): PROBNP in the last 8760 hours. ? ?Radiological Exams: ?IR Removal Tun Cv Cath W/O FL ? ?Addendum Date: 12/08/2021   ?ADDENDUM REPORT: 12/08/2021 16:53 ADDENDUM: Initial report contains incorrect information. Please refer to report  below. INDICATION: PermaCath no longer needed EXAM: REMOVAL TUNNELED CENTRAL VENOUS CATHETER MEDICATIONS: None ANESTHESIA/SEDATION: Local only FLUOROSCOPY TIME:  None used. COMPLICATIONS: None immediate. PROCEDURE: Informed written consent was obtained from the patient's wife after a thorough discussion of the procedural risks, benefits and alternatives. All questions were addressed. Maximal Sterile Barrier Technique was utilized including caps, mask, sterile gowns, sterile gloves, sterile drape, hand hygiene and skin antiseptic. A timeout was performed prior to the initiation of the procedure. The patient's right chest and catheter was prepped and draped in a normal sterile fashion. Heparin was removed from both ports of catheter. 1% lidocaine was used for local anesthesia. Using gentle blunt dissection the cuff of the catheter was exposed and the catheter was removed in its entirety. Pressure was held till hemostasis was obtained. A sterile dressing was applied. The patient tolerated the procedure well with no  immediate complications. IMPRESSION: Successful catheter removal as described above. Performed and dictated by Pasty Spillers, PA-C Electronically Signed   By: Jacqulynn Cadet M.D.   On: 12/08/2021 16:53  ? ?Result Date: 12/08/2021 ?INDICATION: Renal failure. The patient requires removal of a tunneled dialysis catheter. EXAM: REMOVAL OF TUNNELED CENTRAL VENOUS CATHETER MEDICATIONS: None. ANESTHESIA/SEDATION: None. FLUOROSCOPY: None. COMPLICATIONS: None immediate. PROCEDURE: The left chest dialysis catheter site was prepped with chlorhexidine. A sterile gown and gloves were worn during the procedure. Local anesthesia was provided with 1% lidocaine. Utilizing sharp and blunt dissection, the subcutaneous cuff of the dialysis catheter was freed. The catheter was then successfully removed in its entirety. A sterile dressing was applied over the catheter exit site. IMPRESSION: Removal of tunneled dialysis catheter utilizing sharp and blunt dissection. Electronically Signed: By: Jacqulynn Cadet M.D. On: 12/08/2021 14:52   ? ?Assessment/Plan ?Active Problems: ?  Acute on chronic respiratory failure with hypoxia (HCC) ?  Spinal epidural abscess ?  Alcohol withdrawal delirium ([place]) ?  Acute renal failure due to tubular necrosis (HCC) ?  Severe sepsis ([place]) ? ?Acute on chronic respiratory failure with hypoxia-completed 16-hour goal of pressure support yesterday.  Attempt to do ATC 2 hours today pressure support 14 hours.  Continues to have increased secretions but does have a good strong productive cough.  Continue with pulmonary toilet. ?Spinal epidural abscess-no change overall noted.  Infectious disease is following case.  Continue to monitor CBC. ?Alcohol withdrawal-currently no obvious change, continue with supportive care. ?Acute renal failure-continue with supportive care, strict I's and O's. ?Severe sepsis-has been treated and currently resolved.  Patient remains hemodynamically stable. ?  ? ? ?I have personally  seen and evaluated the patient, evaluated laboratory and imaging results, formulated the assessment and plan and placed orders. ?The Patient requires high complexity decision making with multiple systems involvement.  ?Rounds were done with the Respiratory Therapy Director and Staff therapists and discussed with nursing staff also. ? ?Allyne Gee, MD FCCP ?Pulmonary Critical Care Medicine ?Sleep Medicine  ?

## 2021-12-10 DIAGNOSIS — F10931 Alcohol use, unspecified with withdrawal delirium: Secondary | ICD-10-CM | POA: Diagnosis not present

## 2021-12-10 DIAGNOSIS — J9621 Acute and chronic respiratory failure with hypoxia: Secondary | ICD-10-CM | POA: Diagnosis not present

## 2021-12-10 DIAGNOSIS — A419 Sepsis, unspecified organism: Secondary | ICD-10-CM | POA: Diagnosis not present

## 2021-12-10 DIAGNOSIS — N170 Acute kidney failure with tubular necrosis: Secondary | ICD-10-CM | POA: Diagnosis not present

## 2021-12-10 LAB — CBC
HCT: 24.1 % — ABNORMAL LOW (ref 39.0–52.0)
Hemoglobin: 7.4 g/dL — ABNORMAL LOW (ref 13.0–17.0)
MCH: 29 pg (ref 26.0–34.0)
MCHC: 30.7 g/dL (ref 30.0–36.0)
MCV: 94.5 fL (ref 80.0–100.0)
Platelets: 387 K/uL (ref 150–400)
RBC: 2.55 MIL/uL — ABNORMAL LOW (ref 4.22–5.81)
RDW: 14.6 % (ref 11.5–15.5)
WBC: 6.9 K/uL (ref 4.0–10.5)
nRBC: 0 % (ref 0.0–0.2)

## 2021-12-10 LAB — CULTURE, RESPIRATORY W GRAM STAIN: Gram Stain: NONE SEEN

## 2021-12-10 LAB — MAGNESIUM: Magnesium: 1.6 mg/dL — ABNORMAL LOW (ref 1.7–2.4)

## 2021-12-10 NOTE — Progress Notes (Addendum)
Pulmonary Critical Care Medicine ?[place] ?  ?PULMONARY CRITICAL CARE SERVICE ? ?PROGRESS NOTE ? ? ? ? ?Paul Mckenzie  ?[id number]:[id number]  ?DOB: 1971-08-07  ? ?DOA: 12/01/2021 ? ?Referring Physician: Satira Sark, MD ? ?HPI: Paul Mckenzie is a 51 y.o. male being followed for ventilator/airway/oxygen weaning Acute on Chronic Respiratory Failure.  Patient seen sitting up in bed, wife at bedside.  Patient currently on full support.  Patient has done extremely well with weaning process.  He has a strong productive cough.  Does continue to have increased secretions.  No acute distress, no acute overnight events. ? ?Medications: ?Reviewed on Rounds ? ?Physical Exam: ? ?Vitals: Temp 96.4, pulse 85, respirations 24, BP 158/104, SPO2 95% ? ?Ventilator Settings AC PC 28%, rate 18, PEEP 5 ? ?General: Comfortable at this time ?Neck: supple ?Cardiovascular: no malignant arrhythmias ?Respiratory: Bilaterally clear post cough ?Skin: no rash seen on limited exam ?Musculoskeletal: No gross abnormality ?Psychiatric:unable to assess ?Neurologic:no involuntary movements   ?   ?   ?Lab Data:  ? ?Basic Metabolic Panel: ?Recent Labs  ?Lab 12/04/21 ?0403 12/04/21 ?1243 12/06/21 ?3428 12/09/21 ?7681 12/09/21 ?1437 12/09/21 ?1947 12/10/21 ?1572  ?NA 139  --  134* 135  --   --   --   ?K 3.8  --  3.7 6.0* 4.6  --   --   ?CL 103  --  101 103  --   --   --   ?CO2 27  --  26 26  --   --   --   ?GLUCOSE 92  --  123* 144*  --   --   --   ?BUN 10  --  14 14  --   --   --   ?CREATININE 0.65  --  0.76 0.51*  --   --   --   ?CALCIUM 8.5*  --  7.9* 8.3*  --   --   --   ?MG 2.0   < > 1.7 1.3* 1.9 1.7 1.6*  ?PHOS 4.5  --   --   --   --   --   --   ? < > = values in this interval not displayed.  ? ? ?ABG: ?Recent Labs  ?Lab 12/03/21 ?1500  ?PHART 7.48*  ?PCO2ART 43  ?PO2ART 148*  ?HCO3 32.0*  ?O2SAT 98.4  ? ? ?Liver Function Tests: ?Recent Labs  ?Lab 12/04/21 ?0403  ?ALBUMIN 1.7*  ? ?No results for input(s): LIPASE,  AMYLASE in the last 168 hours. ?Recent Labs  ?Lab 12/03/21 ?1450  ?AMMONIA 35  ? ? ?CBC: ?Recent Labs  ?Lab 12/03/21 ?1450 12/04/21 ?0403 12/06/21 ?0625 12/09/21 ?6203 12/10/21 ?5597  ?WBC 8.9 7.6 11.5* 5.2 6.9  ?HGB 7.3* 8.0* 7.9* 7.1* 7.4*  ?HCT 22.4* 25.4* 24.1* 22.8* 24.1*  ?MCV 92.2 93.4 92.7 94.2 94.5  ?PLT 422* 432* 435* 270 387  ? ? ?Cardiac Enzymes: ?No results for input(s): CKTOTAL, CKMB, CKMBINDEX, TROPONINI in the last 168 hours. ? ?BNP (last 3 results) ?Recent Labs  ?  12/02/21 ?0736  ?BNP 239.5*  ? ? ?ProBNP (last 3 results) ?No results for input(s): PROBNP in the last 8760 hours. ? ?Radiological Exams: ?IR Removal Tun Cv Cath W/O FL ? ?Addendum Date: 12/08/2021   ?ADDENDUM REPORT: 12/08/2021 16:53 ADDENDUM: Initial report contains incorrect information. Please refer to report below. INDICATION: PermaCath no longer needed EXAM: REMOVAL TUNNELED CENTRAL VENOUS CATHETER MEDICATIONS: None ANESTHESIA/SEDATION: Local only FLUOROSCOPY TIME:  None used. COMPLICATIONS:  None immediate. PROCEDURE: Informed written consent was obtained from the patient's wife after a thorough discussion of the procedural risks, benefits and alternatives. All questions were addressed. Maximal Sterile Barrier Technique was utilized including caps, mask, sterile gowns, sterile gloves, sterile drape, hand hygiene and skin antiseptic. A timeout was performed prior to the initiation of the procedure. The patient's right chest and catheter was prepped and draped in a normal sterile fashion. Heparin was removed from both ports of catheter. 1% lidocaine was used for local anesthesia. Using gentle blunt dissection the cuff of the catheter was exposed and the catheter was removed in its entirety. Pressure was held till hemostasis was obtained. A sterile dressing was applied. The patient tolerated the procedure well with no immediate complications. IMPRESSION: Successful catheter removal as described above. Performed and dictated by Pasty Spillers, PA-C Electronically Signed   By: Jacqulynn Cadet M.D.   On: 12/08/2021 16:53  ? ?Result Date: 12/08/2021 ?INDICATION: Renal failure. The patient requires removal of a tunneled dialysis catheter. EXAM: REMOVAL OF TUNNELED CENTRAL VENOUS CATHETER MEDICATIONS: None. ANESTHESIA/SEDATION: None. FLUOROSCOPY: None. COMPLICATIONS: None immediate. PROCEDURE: The left chest dialysis catheter site was prepped with chlorhexidine. A sterile gown and gloves were worn during the procedure. Local anesthesia was provided with 1% lidocaine. Utilizing sharp and blunt dissection, the subcutaneous cuff of the dialysis catheter was freed. The catheter was then successfully removed in its entirety. A sterile dressing was applied over the catheter exit site. IMPRESSION: Removal of tunneled dialysis catheter utilizing sharp and blunt dissection. Electronically Signed: By: Jacqulynn Cadet M.D. On: 12/08/2021 14:52   ? ?Assessment/Plan ?Active Problems: ?  Acute on chronic respiratory failure with hypoxia (HCC) ?  Spinal epidural abscess ?  Alcohol withdrawal delirium ([place]) ?  Acute renal failure due to tubular necrosis (HCC) ?  Severe sepsis ([place]) ? ?Acute on chronic respiratory failure with hypoxia-completed 2 hours ATC yesterday with 14 hours pressure support.  Patient is tolerating weaning process extremely well.  I have placed an order to for ATC as tolerated.  He does continue to have thick secretions, however he has a strong productive cough and is able to clear without intervention.  Continue with pulmonary toilet. ?Spinal epidural abscess-no change overall noted.  Infectious disease is following case.  Continue to monitor CBC. ?Alcohol withdrawal-currently no obvious change, continue with supportive care. ?Acute renal failure-continue with supportive care, strict I's and O's. ?Severe sepsis-has been treated and currently resolved.  Patient remains hemodynamically stable. ? ? ?I have personally seen and evaluated the  patient, evaluated laboratory and imaging results, formulated the assessment and plan and placed orders. ?The Patient requires high complexity decision making with multiple systems involvement.  ?Rounds were done with the Respiratory Therapy Director and Staff therapists and discussed with nursing staff also. ? ?Allyne Gee, MD FCCP ?Pulmonary Critical Care Medicine ?Sleep Medicine  ?

## 2021-12-11 DIAGNOSIS — A419 Sepsis, unspecified organism: Secondary | ICD-10-CM | POA: Diagnosis not present

## 2021-12-11 DIAGNOSIS — J9621 Acute and chronic respiratory failure with hypoxia: Secondary | ICD-10-CM | POA: Diagnosis not present

## 2021-12-11 DIAGNOSIS — F10931 Alcohol use, unspecified with withdrawal delirium: Secondary | ICD-10-CM | POA: Diagnosis not present

## 2021-12-11 DIAGNOSIS — N170 Acute kidney failure with tubular necrosis: Secondary | ICD-10-CM | POA: Diagnosis not present

## 2021-12-11 LAB — BLOOD GAS, ARTERIAL
Acid-Base Excess: 7.2 mmol/L — ABNORMAL HIGH (ref 0.0–2.0)
Bicarbonate: 32.6 mmol/L — ABNORMAL HIGH (ref 20.0–28.0)
O2 Saturation: 99.8 %
Patient temperature: 37
pCO2 arterial: 48 mmHg (ref 32–48)
pH, Arterial: 7.44 (ref 7.35–7.45)
pO2, Arterial: 103 mmHg (ref 83–108)

## 2021-12-11 LAB — CULTURE, BLOOD (ROUTINE X 2)
Culture: NO GROWTH
Culture: NO GROWTH
Special Requests: ADEQUATE
Special Requests: ADEQUATE

## 2021-12-11 LAB — MAGNESIUM: Magnesium: 1.7 mg/dL (ref 1.7–2.4)

## 2021-12-11 NOTE — Progress Notes (Addendum)
Pulmonary Critical Care Medicine ?[place] ?  ?PULMONARY CRITICAL CARE SERVICE ? ?PROGRESS NOTE ? ? ? ? ?Paul Mckenzie  ?[id number]:[id number]  ?DOB: [birth year]  ? ?DOA: 12/01/2021 ? ?Referring Physician: Satira Sark, MD ? ?HPI: Paul Mckenzie is a 51 y.o. male being followed for ventilator/airway/oxygen weaning Acute on Chronic Respiratory Failure.  Patient seen lying in bed, wife at bedside.  Continues to tolerate ATC well.  Apparently he had an panic/anxiety episode for just minutes yesterday requiring being placed on the vent for just a a few minutes.  Overall he has been on ATC for almost 24 hours with the exception of that very short episode.  No acute distress, no acute overnight events. ? ?Medications: ?Reviewed on Rounds ? ?Physical Exam: ? ?Vitals: Temp 97.3, pulse 88, respirations 23, BP 167/108, SPO2 96% ? ?Ventilator Settings ATC 28% ? ?General: Comfortable at this time ?Neck: supple ?Cardiovascular: no malignant arrhythmias ?Respiratory: Bilaterally clear post cough, does sound slightly hoarse ?Skin: no rash seen on limited exam ?Musculoskeletal: No gross abnormality ?Psychiatric:unable to assess ?Neurologic:no involuntary movements   ?   ?   ?Lab Data:  ? ?Basic Metabolic Panel: ?Recent Labs  ?Lab 12/06/21 ?0625 12/09/21 ?4982 12/09/21 ?1437 12/09/21 ?1947 12/10/21 ?6415 12/11/21 ?0421  ?NA 134* 135  --   --   --   --   ?K 3.7 6.0* 4.6  --   --   --   ?CL 101 103  --   --   --   --   ?CO2 26 26  --   --   --   --   ?GLUCOSE 123* 144*  --   --   --   --   ?BUN 14 14  --   --   --   --   ?CREATININE 0.76 0.51*  --   --   --   --   ?CALCIUM 7.9* 8.3*  --   --   --   --   ?MG 1.7 1.3* 1.9 1.7 1.6* 1.7  ? ? ?ABG: ?No results for input(s): PHART, PCO2ART, PO2ART, HCO3, O2SAT in the last 168 hours. ? ?Liver Function Tests: ?No results for input(s): AST, ALT, ALKPHOS, BILITOT, PROT, ALBUMIN in the last 168 hours. ?No results for input(s): LIPASE, AMYLASE in the last 168  hours. ?No results for input(s): AMMONIA in the last 168 hours. ? ?CBC: ?Recent Labs  ?Lab 12/06/21 ?0625 12/09/21 ?8309 12/10/21 ?4076  ?WBC 11.5* 5.2 6.9  ?HGB 7.9* 7.1* 7.4*  ?HCT 24.1* 22.8* 24.1*  ?MCV 92.7 94.2 94.5  ?PLT 435* 270 387  ? ? ?Cardiac Enzymes: ?No results for input(s): CKTOTAL, CKMB, CKMBINDEX, TROPONINI in the last 168 hours. ? ?BNP (last 3 results) ?Recent Labs  ?  12/02/21 ?0736  ?BNP 239.5*  ? ? ?ProBNP (last 3 results) ?No results for input(s): PROBNP in the last 8760 hours. ? ?Radiological Exams: ?No results found. ? ?Assessment/Plan ?Active Problems: ?  Acute on chronic respiratory failure with hypoxia (HCC) ?  Spinal epidural abscess ?  Alcohol withdrawal delirium ([place]) ?  Acute renal failure due to tubular necrosis (HCC) ?  Severe sepsis ([place]) ? ?Acute on chronic respiratory failure with hypoxia-patient was placed on ATC as tolerated yesterday.  He has been on ATC for almost 24 hours, with the exception of a few minute episode requiring ventilator.  Respiratory said this was a panic/anxiety attack and was very short.  He continues to have increased secretions but does  have a good strong cough.  We will reevaluate for speech eval and PMV placement once his secretions clear.  Continue with pulmonary toilet. ?Alcohol withdrawal-currently no obvious change, continue with supportive care. ?Acute renal failure-continue with supportive care, strict I's and O's. ?Severe sepsis-has been treated and currently resolved.  Patient remains hemodynamically stable. ? ? ?I have personally seen and evaluated the patient, evaluated laboratory and imaging results, formulated the assessment and plan and placed orders. ?The Patient requires high complexity decision making with multiple systems involvement.  ?Rounds were done with the Respiratory Therapy Director and Staff therapists and discussed with nursing staff also. ? ?Allyne Gee, MD FCCP ?Pulmonary Critical Care Medicine ?Sleep Medicine  ?

## 2021-12-12 DIAGNOSIS — F10931 Alcohol use, unspecified with withdrawal delirium: Secondary | ICD-10-CM | POA: Diagnosis not present

## 2021-12-12 DIAGNOSIS — J9621 Acute and chronic respiratory failure with hypoxia: Secondary | ICD-10-CM | POA: Diagnosis not present

## 2021-12-12 DIAGNOSIS — N170 Acute kidney failure with tubular necrosis: Secondary | ICD-10-CM | POA: Diagnosis not present

## 2021-12-12 DIAGNOSIS — A419 Sepsis, unspecified organism: Secondary | ICD-10-CM | POA: Diagnosis not present

## 2021-12-12 LAB — COMPREHENSIVE METABOLIC PANEL
ALT: 7 U/L (ref 0–44)
AST: 17 U/L (ref 15–41)
Albumin: 2.1 g/dL — ABNORMAL LOW (ref 3.5–5.0)
Alkaline Phosphatase: 145 U/L — ABNORMAL HIGH (ref 38–126)
Anion gap: 5 (ref 5–15)
BUN: 14 mg/dL (ref 6–20)
CO2: 31 mmol/L (ref 22–32)
Calcium: 8.7 mg/dL — ABNORMAL LOW (ref 8.9–10.3)
Chloride: 103 mmol/L (ref 98–111)
Creatinine, Ser: 0.56 mg/dL — ABNORMAL LOW (ref 0.61–1.24)
GFR, Estimated: >60 mL/min (ref >60)
Glucose, Bld: 117 mg/dL — ABNORMAL HIGH (ref 70–99)
Potassium: 4.2 mmol/L (ref 3.5–5.1)
Sodium: 139 mmol/L (ref 135–145)
Total Bilirubin: 0.4 mg/dL (ref 0.3–1.2)
Total Protein: 7.1 g/dL (ref 6.5–8.1)

## 2021-12-12 LAB — CBC
HCT: 25.9 % — ABNORMAL LOW (ref 39.0–52.0)
Hemoglobin: 8 g/dL — ABNORMAL LOW (ref 13.0–17.0)
MCH: 29 pg (ref 26.0–34.0)
MCHC: 30.9 g/dL (ref 30.0–36.0)
MCV: 93.8 fL (ref 80.0–100.0)
Platelets: 451 10*3/uL — ABNORMAL HIGH (ref 150–400)
RBC: 2.76 MIL/uL — ABNORMAL LOW (ref 4.22–5.81)
RDW: 14.8 % (ref 11.5–15.5)
WBC: 8.2 10*3/uL (ref 4.0–10.5)
nRBC: 0 % (ref 0.0–0.2)

## 2021-12-12 LAB — MAGNESIUM: Magnesium: 1.6 mg/dL — ABNORMAL LOW (ref 1.7–2.4)

## 2021-12-12 NOTE — Progress Notes (Addendum)
Pulmonary Critical Care Medicine ?[place] ?  ?PULMONARY CRITICAL CARE SERVICE ? ?PROGRESS NOTE ? ? ? ? ?Paul Mckenzie  ?[id number]  ?DOB: [birth year]  ? ?DOA: 12/01/2021 ? ?Referring Physician: Satira Sark, MD ? ?HPI: Paul Mckenzie is a 51 y.o. male being followed for ventilator/airway/oxygen weaning Acute on Chronic Respiratory Failure.  Patient seen sitting up in bed, wife at bedside.  Patient has completed almost 48 hours of ATC.  Speech did work with him yesterday and PMV was placed for about 30 minutes.  He became slightly tachypneic and his SPO2 dropped slightly.  It was determined it be beneficial to remove PMV.  Continues to have moderate amount of secretions.  Does have strong productive cough.  No acute distress, no acute overnight events. ? ?Medications: ?Reviewed on Rounds ? ?Physical Exam: ? ?Vitals: Temp 98.1, pulse 87, respirations 26, BP 167/97, SPO2 98% ? ?Ventilator Settings ATC 28% ? ?General: Comfortable at this time ?Neck: supple ?Cardiovascular: no malignant arrhythmias ?Respiratory: Bilaterally coarse ?Skin: no rash seen on limited exam ?Musculoskeletal: No gross abnormality ?Psychiatric:unable to assess ?Neurologic:no involuntary movements   ?   ?   ?Lab Data:  ? ?Basic Metabolic Panel: ?Recent Labs  ?Lab 12/06/21 ?0625 12/09/21 ?5638 12/09/21 ?1437 12/09/21 ?1947 12/10/21 ?9373 12/11/21 ?0421 12/12/21 ?0403  ?NA 134* 135  --   --   --   --  139  ?K 3.7 6.0* 4.6  --   --   --  4.2  ?CL 101 103  --   --   --   --  103  ?CO2 26 26  --   --   --   --  31  ?GLUCOSE 123* 144*  --   --   --   --  117*  ?BUN 14 14  --   --   --   --  14  ?CREATININE 0.76 0.51*  --   --   --   --  0.56*  ?CALCIUM 7.9* 8.3*  --   --   --   --  8.7*  ?MG 1.7 1.3* 1.9 1.7 1.6* 1.7 1.6*  ? ? ?ABG: ?Recent Labs  ?Lab 12/11/21 ?1000  ?PHART 7.44  ?PCO2ART 48  ?PO2ART 103  ?HCO3 32.6*  ?O2SAT 99.8  ? ? ?Liver Function Tests: ?Recent Labs  ?Lab 12/12/21 ?0403  ?AST 17  ?ALT 7   ?ALKPHOS 145*  ?BILITOT 0.4  ?PROT 7.1  ?ALBUMIN 2.1*  ? ?No results for input(s): LIPASE, AMYLASE in the last 168 hours. ?No results for input(s): AMMONIA in the last 168 hours. ? ?CBC: ?Recent Labs  ?Lab 12/06/21 ?0625 12/09/21 ?4287 12/10/21 ?6811 12/12/21 ?0403  ?WBC 11.5* 5.2 6.9 8.2  ?HGB 7.9* 7.1* 7.4* 8.0*  ?HCT 24.1* 22.8* 24.1* 25.9*  ?MCV 92.7 94.2 94.5 93.8  ?PLT 435* 270 387 451*  ? ? ?Cardiac Enzymes: ?No results for input(s): CKTOTAL, CKMB, CKMBINDEX, TROPONINI in the last 168 hours. ? ?BNP (last 3 results) ?Recent Labs  ?  12/02/21 ?0736  ?BNP 239.5*  ? ? ?ProBNP (last 3 results) ?No results for input(s): PROBNP in the last 8760 hours. ? ?Radiological Exams: ?No results found. ? ?Assessment/Plan ?Active Problems: ?  Acute on chronic respiratory failure with hypoxia (HCC) ?  Spinal epidural abscess ?  Alcohol withdrawal delirium ([place]) ?  Acute renal failure due to tubular necrosis (HCC) ?  Severe sepsis ([place]) ? ?Acute on chronic respiratory failure with hypoxia-patient has been on ATC 28%  for almost 48 hours.  Each worked with him yesterday and he was able to tolerate about 30 minutes of PMV before dropping his SPO2 and becoming tachypneic.  He continues to have moderate amount of secretions but does have a good strong productive cough. We will continue to reevaluate for PMV placement once his secretions clear.  Continue with pulmonary toilet. ?Alcohol withdrawal-currently no obvious change, continue with supportive care. ?Acute renal failure-continue with supportive care, strict I's and O's. ?Severe sepsis-has been treated and currently resolved.  Patient remains hemodynamically stable. ? ? ?I have personally seen and evaluated the patient, evaluated laboratory and imaging results, formulated the assessment and plan and placed orders. ?The Patient requires high complexity decision making with multiple systems involvement.  ?Rounds were done with the Respiratory Therapy Director and Staff therapists  and discussed with nursing staff also. ? ?Allyne Gee, MD FCCP ?Pulmonary Critical Care Medicine ?Sleep Medicine  ?

## 2021-12-13 DIAGNOSIS — A419 Sepsis, unspecified organism: Secondary | ICD-10-CM | POA: Diagnosis not present

## 2021-12-13 DIAGNOSIS — J9621 Acute and chronic respiratory failure with hypoxia: Secondary | ICD-10-CM | POA: Diagnosis not present

## 2021-12-13 DIAGNOSIS — F10931 Alcohol use, unspecified with withdrawal delirium: Secondary | ICD-10-CM | POA: Diagnosis not present

## 2021-12-13 DIAGNOSIS — N170 Acute kidney failure with tubular necrosis: Secondary | ICD-10-CM | POA: Diagnosis not present

## 2021-12-13 LAB — MAGNESIUM: Magnesium: 1.5 mg/dL — ABNORMAL LOW (ref 1.7–2.4)

## 2021-12-13 NOTE — Progress Notes (Addendum)
Pulmonary Critical Care Medicine ?[place] ?  ?PULMONARY CRITICAL CARE SERVICE ? ?PROGRESS NOTE ? ? ? ? ?Paul Mckenzie  ?[id number]:[id number]  ?DOB: [birth year]  ? ?DOA: 12/01/2021 ? ?Referring Physician: Satira Sark, MD ? ?HPI: Paul Mckenzie is a 51 y.o. male being followed for ventilator/airway/oxygen weaning Acute on Chronic Respiratory Failure.  Patient seen sitting up in bed, sister at bedside.  Patient currently has been on ATC for almost 72 hours with PMV in place.  He is tolerating very well.  Apparently he has a swallow evaluation scheduled for Friday.  Would like his scopolamine patch discontinued at this time as he believes he is too dry.  No acute distress, no acute overnight events.   ? ?Medications: ?Reviewed on Rounds ? ?Physical Exam: ? ?Vitals: Temp 97.7, pulse 92, respirations 20, BP 167/107, SPO2 95% ? ?Ventilator Settings ATC 28% with PMV in place ? ?General: Comfortable at this time ?Neck: supple ?Cardiovascular: no malignant arrhythmias ?Respiratory: Bilaterally coarse on expiration ?Skin: no rash seen on limited exam ?Musculoskeletal: No gross abnormality ?Psychiatric:unable to assess ?Neurologic:no involuntary movements   ?   ?   ?Lab Data:  ? ?Basic Metabolic Panel: ?Recent Labs  ?Lab 12/09/21 ?7412 12/09/21 ?1437 12/09/21 ?1947 12/10/21 ?8786 12/11/21 ?0421 12/12/21 ?0403 12/13/21 ?7672  ?NA 135  --   --   --   --  139  --   ?K 6.0* 4.6  --   --   --  4.2  --   ?CL 103  --   --   --   --  103  --   ?CO2 26  --   --   --   --  31  --   ?GLUCOSE 144*  --   --   --   --  117*  --   ?BUN 14  --   --   --   --  14  --   ?CREATININE 0.51*  --   --   --   --  0.56*  --   ?CALCIUM 8.3*  --   --   --   --  8.7*  --   ?MG 1.3* 1.9 1.7 1.6* 1.7 1.6* 1.5*  ? ? ?ABG: ?Recent Labs  ?Lab 12/11/21 ?1000  ?PHART 7.44  ?PCO2ART 48  ?PO2ART 103  ?HCO3 32.6*  ?O2SAT 99.8  ? ? ?Liver Function Tests: ?Recent Labs  ?Lab 12/12/21 ?0403  ?AST 17  ?ALT 7  ?ALKPHOS 145*  ?BILITOT 0.4   ?PROT 7.1  ?ALBUMIN 2.1*  ? ?No results for input(s): LIPASE, AMYLASE in the last 168 hours. ?No results for input(s): AMMONIA in the last 168 hours. ? ?CBC: ?Recent Labs  ?Lab 12/09/21 ?0947 12/10/21 ?0962 12/12/21 ?0403  ?WBC 5.2 6.9 8.2  ?HGB 7.1* 7.4* 8.0*  ?HCT 22.8* 24.1* 25.9*  ?MCV 94.2 94.5 93.8  ?PLT 270 387 451*  ? ? ?Cardiac Enzymes: ?No results for input(s): CKTOTAL, CKMB, CKMBINDEX, TROPONINI in the last 168 hours. ? ?BNP (last 3 results) ?Recent Labs  ?  12/02/21 ?0736  ?BNP 239.5*  ? ? ?ProBNP (last 3 results) ?No results for input(s): PROBNP in the last 8760 hours. ? ?Radiological Exams: ?No results found. ? ?Assessment/Plan ?Active Problems: ?  Acute on chronic respiratory failure with hypoxia (HCC) ?  Spinal epidural abscess ?  Alcohol withdrawal delirium ([place]) ?  Acute renal failure due to tubular necrosis (HCC) ?  Severe sepsis ([place]) ? ?Acute on chronic respiratory failure  with hypoxia-patient has been on ATC 28% for almost 72 hours.  PMV has remained in place for a day and he has done well.  He was placed on a scopolamine patch 2 days ago and his secretions at this time are decreasing.  Patient is actually requesting that we DC the patch as he feels he is too dry.  Continue with pulmonary toilet. ?Alcohol withdrawal-currently no obvious change, continue with supportive care. ?Acute renal failure-continue with supportive care, strict I's and O's. ?Severe sepsis-has been treated and currently resolved.  Patient remains hemodynamically stable. ? ? ?I have personally seen and evaluated the patient, evaluated laboratory and imaging results, formulated the assessment and plan and placed orders. ?The Patient requires high complexity decision making with multiple systems involvement.  ?Rounds were done with the Respiratory Therapy Director and Staff therapists and discussed with nursing staff also. ? ?Allyne Gee, MD FCCP ?Pulmonary Critical Care Medicine ?Sleep Medicine  ?

## 2021-12-14 ENCOUNTER — Other Ambulatory Visit (HOSPITAL_COMMUNITY): Payer: Self-pay

## 2021-12-14 DIAGNOSIS — A419 Sepsis, unspecified organism: Secondary | ICD-10-CM | POA: Diagnosis not present

## 2021-12-14 DIAGNOSIS — J9621 Acute and chronic respiratory failure with hypoxia: Secondary | ICD-10-CM | POA: Diagnosis not present

## 2021-12-14 DIAGNOSIS — F10931 Alcohol use, unspecified with withdrawal delirium: Secondary | ICD-10-CM | POA: Diagnosis not present

## 2021-12-14 DIAGNOSIS — N170 Acute kidney failure with tubular necrosis: Secondary | ICD-10-CM | POA: Diagnosis not present

## 2021-12-14 LAB — MAGNESIUM: Magnesium: 1.6 mg/dL — ABNORMAL LOW (ref 1.7–2.4)

## 2021-12-14 NOTE — Progress Notes (Signed)
Pulmonary Critical Care Medicine ?[place] ?  ?PULMONARY CRITICAL CARE SERVICE ? ?PROGRESS NOTE ? ? ? ? ?Paul Mckenzie  ?QPY:[id number]  ?DOB: [birth year]  ? ?DOA: 12/01/2021 ? ?Referring Physician: Satira Sark, MD ? ?HPI: Paul Mckenzie is a 51 y.o. male being followed for ventilator/airway/oxygen weaning Acute on Chronic Respiratory Failure.  Patient currently is on T collar has been on 20% FiO2 using the PMV ? ?Medications: ?Reviewed on Rounds ? ?Physical Exam: ? ?Vitals: Temperature is 97.3 pulse of 91 respiratory is 18 blood pressure 163/97 saturations 97% ? ?Ventilator Settings on T collar FiO2 is 28% with PMV ? ?General: Comfortable at this time ?Neck: supple ?Cardiovascular: no malignant arrhythmias ?Respiratory: Scattered rhonchi expansion is equal ?Skin: no rash seen on limited exam ?Musculoskeletal: No gross abnormality ?Psychiatric:unable to assess ?Neurologic:no involuntary movements   ?   ?   ?Lab Data:  ? ?Basic Metabolic Panel: ?Recent Labs  ?Lab 12/09/21 ?1245 12/09/21 ?1437 12/09/21 ?8099 12/10/21 ?8338 12/11/21 ?0421 12/12/21 ?0403 12/13/21 ?2505 12/14/21 ?3976  ?NA 135  --   --   --   --  139  --   --   ?K 6.0* 4.6  --   --   --  4.2  --   --   ?CL 103  --   --   --   --  103  --   --   ?CO2 26  --   --   --   --  31  --   --   ?GLUCOSE 144*  --   --   --   --  117*  --   --   ?BUN 14  --   --   --   --  14  --   --   ?CREATININE 0.51*  --   --   --   --  0.56*  --   --   ?CALCIUM 8.3*  --   --   --   --  8.7*  --   --   ?MG 1.3* 1.9   < > 1.6* 1.7 1.6* 1.5* 1.6*  ? < > = values in this interval not displayed.  ? ? ?ABG: ?Recent Labs  ?Lab 12/11/21 ?1000  ?PHART 7.44  ?PCO2ART 48  ?PO2ART 103  ?HCO3 32.6*  ?O2SAT 99.8  ? ? ?Liver Function Tests: ?Recent Labs  ?Lab 12/12/21 ?0403  ?AST 17  ?ALT 7  ?ALKPHOS 145*  ?BILITOT 0.4  ?PROT 7.1  ?ALBUMIN 2.1*  ? ?No results for input(s): LIPASE, AMYLASE in the last 168 hours. ?No results for input(s): AMMONIA in the  last 168 hours. ? ?CBC: ?Recent Labs  ?Lab 12/09/21 ?7341 12/10/21 ?9379 12/12/21 ?0403  ?WBC 5.2 6.9 8.2  ?HGB 7.1* 7.4* 8.0*  ?HCT 22.8* 24.1* 25.9*  ?MCV 94.2 94.5 93.8  ?PLT 270 387 451*  ? ? ?Cardiac Enzymes: ?No results for input(s): CKTOTAL, CKMB, CKMBINDEX, TROPONINI in the last 168 hours. ? ?BNP (last 3 results) ?Recent Labs  ?  12/02/21 ?0736  ?BNP 239.5*  ? ? ?ProBNP (last 3 results) ?No results for input(s): PROBNP in the last 8760 hours. ? ?Radiological Exams: ?No results found. ? ?Assessment/Plan ?Active Problems: ?  Acute on chronic respiratory failure with hypoxia (HCC) ?  Spinal epidural abscess ?  Alcohol withdrawal delirium ([place]) ?  Acute renal failure due to tubular necrosis (HCC) ?  Severe sepsis ([place]) ? ? ?Acute on chronic respiratory failure hypoxia we will continue  with the T collar and PMV as tolerated try also to see about starting capping trials ?Spinal epidural abscess no change overall we will continue to follow and antibiotics ?Alcohol withdrawal supportive care ?Acute renal failure following patient's lab work closely ?Severe sepsis treated resolved ? ? ?I have personally seen and evaluated the patient, evaluated laboratory and imaging results, formulated the assessment and plan and placed orders. ?The Patient requires high complexity decision making with multiple systems involvement.  ?Rounds were done with the Respiratory Therapy Director and Staff therapists and discussed with nursing staff also. ? ?Allyne Gee, MD FCCP ?Pulmonary Critical Care Medicine ?Sleep Medicine ? ?

## 2021-12-15 DIAGNOSIS — J9621 Acute and chronic respiratory failure with hypoxia: Secondary | ICD-10-CM | POA: Diagnosis not present

## 2021-12-15 DIAGNOSIS — N170 Acute kidney failure with tubular necrosis: Secondary | ICD-10-CM | POA: Diagnosis not present

## 2021-12-15 DIAGNOSIS — A419 Sepsis, unspecified organism: Secondary | ICD-10-CM | POA: Diagnosis not present

## 2021-12-15 DIAGNOSIS — F10931 Alcohol use, unspecified with withdrawal delirium: Secondary | ICD-10-CM | POA: Diagnosis not present

## 2021-12-15 LAB — CBC
HCT: 23 % — ABNORMAL LOW (ref 39.0–52.0)
Hemoglobin: 7.6 g/dL — ABNORMAL LOW (ref 13.0–17.0)
MCH: 30.2 pg (ref 26.0–34.0)
MCHC: 33 g/dL (ref 30.0–36.0)
MCV: 91.3 fL (ref 80.0–100.0)
Platelets: 311 10*3/uL (ref 150–400)
RBC: 2.52 MIL/uL — ABNORMAL LOW (ref 4.22–5.81)
RDW: 15.2 % (ref 11.5–15.5)
WBC: 9.2 10*3/uL (ref 4.0–10.5)
nRBC: 0 % (ref 0.0–0.2)

## 2021-12-15 LAB — BASIC METABOLIC PANEL
Anion gap: 6 (ref 5–15)
BUN: 11 mg/dL (ref 6–20)
CO2: 31 mmol/L (ref 22–32)
Calcium: 8.7 mg/dL — ABNORMAL LOW (ref 8.9–10.3)
Chloride: 96 mmol/L — ABNORMAL LOW (ref 98–111)
Creatinine, Ser: 0.6 mg/dL — ABNORMAL LOW (ref 0.61–1.24)
GFR, Estimated: >60 mL/min (ref >60)
Glucose, Bld: 102 mg/dL — ABNORMAL HIGH (ref 70–99)
Potassium: 4.5 mmol/L (ref 3.5–5.1)
Sodium: 133 mmol/L — ABNORMAL LOW (ref 135–145)

## 2021-12-15 LAB — MAGNESIUM: Magnesium: 1.8 mg/dL (ref 1.7–2.4)

## 2021-12-15 NOTE — Progress Notes (Signed)
Pulmonary Critical Care Medicine ?[place] ?  ?PULMONARY CRITICAL CARE SERVICE ? ?PROGRESS NOTE ? ? ? ? ?Paul Mckenzie  ?GYK:[id number]  ?DOB: September 02, 1971  ? ?DOA: 12/01/2021 ? ?Referring Physician: Satira Sark, MD ? ?HPI: Paul Mckenzie is a 51 y.o. male being followed for ventilator/airway/oxygen weaning Acute on Chronic Respiratory Failure.  Patient is comfortable at this time has been tolerating capping supposed be doing 24 hours ? ?Medications: ?Reviewed on Rounds ? ?Physical Exam: ? ?Vitals: Temperature is 97.6 pulse of 90 respiratory 35 blood pressure 148/91 saturations 98% ? ?Ventilator Settings capping off the ventilator ? ?General: Comfortable at this time ?Neck: supple ?Cardiovascular: no malignant arrhythmias ?Respiratory: No rhonchi no rales are noted at this time ?Skin: no rash seen on limited exam ?Musculoskeletal: No gross abnormality ?Psychiatric:unable to assess ?Neurologic:no involuntary movements   ?   ?   ?Lab Data:  ? ?Basic Metabolic Panel: ?Recent Labs  ?Lab 12/09/21 ?7939 12/09/21 ?1437 12/09/21 ?1947 12/11/21 ?0421 12/12/21 ?0403 12/13/21 ?0300 12/14/21 ?9233 12/15/21 ?0422  ?NA 135  --   --   --  139  --   --  133*  ?K 6.0* 4.6  --   --  4.2  --   --  4.5  ?CL 103  --   --   --  103  --   --  96*  ?CO2 26  --   --   --  31  --   --  31  ?GLUCOSE 144*  --   --   --  117*  --   --  102*  ?BUN 14  --   --   --  14  --   --  11  ?CREATININE 0.51*  --   --   --  0.56*  --   --  0.60*  ?CALCIUM 8.3*  --   --   --  8.7*  --   --  8.7*  ?MG 1.3* 1.9   < > 1.7 1.6* 1.5* 1.6* 1.8  ? < > = values in this interval not displayed.  ? ? ?ABG: ?Recent Labs  ?Lab 12/11/21 ?1000  ?PHART 7.44  ?PCO2ART 48  ?PO2ART 103  ?HCO3 32.6*  ?O2SAT 99.8  ? ? ?Liver Function Tests: ?Recent Labs  ?Lab 12/12/21 ?0403  ?AST 17  ?ALT 7  ?ALKPHOS 145*  ?BILITOT 0.4  ?PROT 7.1  ?ALBUMIN 2.1*  ? ?No results for input(s): LIPASE, AMYLASE in the last 168 hours. ?No results for input(s):  AMMONIA in the last 168 hours. ? ?CBC: ?Recent Labs  ?Lab 12/09/21 ?0076 12/10/21 ?2263 12/12/21 ?0403 12/15/21 ?0422  ?WBC 5.2 6.9 8.2 9.2  ?HGB 7.1* 7.4* 8.0* 7.6*  ?HCT 22.8* 24.1* 25.9* 23.0*  ?MCV 94.2 94.5 93.8 91.3  ?PLT 270 387 451* 311  ? ? ?Cardiac Enzymes: ?No results for input(s): CKTOTAL, CKMB, CKMBINDEX, TROPONINI in the last 168 hours. ? ?BNP (last 3 results) ?Recent Labs  ?  12/02/21 ?0736  ?BNP 239.5*  ? ? ?ProBNP (last 3 results) ?No results for input(s): PROBNP in the last 8760 hours. ? ?Radiological Exams: ?No results found. ? ?Assessment/Plan ?Active Problems: ?  Acute on chronic respiratory failure with hypoxia (HCC) ?  Spinal epidural abscess ?  Alcohol withdrawal delirium ([place]) ?  Acute renal failure due to tubular necrosis (HCC) ?  Severe sepsis ([place]) ? ? ?Acute on chronic respiratory failure hypoxia we will continue with the weaning process on capping as tolerated patient  should do 24 hours ?Spinal epidural abscess been treated with antibiotics awaiting rehab ?Alcohol withdrawal no sign of active withdrawal at this time ?Acute renal failure no change ?Severe sepsis treated resolved ? ? ?I have personally seen and evaluated the patient, evaluated laboratory and imaging results, formulated the assessment and plan and placed orders. ?The Patient requires high complexity decision making with multiple systems involvement.  ?Rounds were done with the Respiratory Therapy Director and Staff therapists and discussed with nursing staff also. ? ?Allyne Gee, MD FCCP ?Pulmonary Critical Care Medicine ?Sleep Medicine ? ?

## 2021-12-16 DIAGNOSIS — J9621 Acute and chronic respiratory failure with hypoxia: Secondary | ICD-10-CM | POA: Diagnosis not present

## 2021-12-16 DIAGNOSIS — F10931 Alcohol use, unspecified with withdrawal delirium: Secondary | ICD-10-CM | POA: Diagnosis not present

## 2021-12-16 DIAGNOSIS — A419 Sepsis, unspecified organism: Secondary | ICD-10-CM | POA: Diagnosis not present

## 2021-12-16 DIAGNOSIS — N170 Acute kidney failure with tubular necrosis: Secondary | ICD-10-CM | POA: Diagnosis not present

## 2021-12-16 LAB — CBC
HCT: 23.1 % — ABNORMAL LOW (ref 39.0–52.0)
Hemoglobin: 7.4 g/dL — ABNORMAL LOW (ref 13.0–17.0)
MCH: 29.7 pg (ref 26.0–34.0)
MCHC: 32 g/dL (ref 30.0–36.0)
MCV: 92.8 fL (ref 80.0–100.0)
Platelets: 287 10*3/uL (ref 150–400)
RBC: 2.49 MIL/uL — ABNORMAL LOW (ref 4.22–5.81)
RDW: 15.3 % (ref 11.5–15.5)
WBC: 8 10*3/uL (ref 4.0–10.5)
nRBC: 0 % (ref 0.0–0.2)

## 2021-12-16 NOTE — Progress Notes (Signed)
Pulmonary Critical Care Medicine ?[place] ?  ?PULMONARY CRITICAL CARE SERVICE ? ?PROGRESS NOTE ? ? ? ? ?Paul Mckenzie  ?[id number]:[id number]  ?DOB: 1971-03-02  ? ?DOA: 12/01/2021 ? ?Referring Physician: Satira Sark, MD ? ?HPI: Paul Mckenzie is a 51 y.o. male being followed for ventilator/airway/oxygen weaning Acute on Chronic Respiratory Failure.  Patient is capping has been continually capping since yesterday looks good so far ? ?Medications: ?Reviewed on Rounds ? ?Physical Exam: ? ?Vitals: Temperature is 99.9 pulse 97 respiratory 26 blood pressure 121/75 saturations 98% ? ?Ventilator Settings capping doing fairly well ? ?General: Comfortable at this time ?Neck: supple ?Cardiovascular: no malignant arrhythmias ?Respiratory: No rhonchi very coarse breath sounds ?Skin: no rash seen on limited exam ?Musculoskeletal: No gross abnormality ?Psychiatric:unable to assess ?Neurologic:no involuntary movements   ?   ?   ?Lab Data:  ? ?Basic Metabolic Panel: ?Recent Labs  ?Lab 12/11/21 ?0421 12/12/21 ?0403 12/13/21 ?0932 12/14/21 ?6712 12/15/21 ?0422  ?NA  --  139  --   --  133*  ?K  --  4.2  --   --  4.5  ?CL  --  103  --   --  96*  ?CO2  --  31  --   --  31  ?GLUCOSE  --  117*  --   --  102*  ?BUN  --  14  --   --  11  ?CREATININE  --  0.56*  --   --  0.60*  ?CALCIUM  --  8.7*  --   --  8.7*  ?MG 1.7 1.6* 1.5* 1.6* 1.8  ? ? ?ABG: ?Recent Labs  ?Lab 12/11/21 ?1000  ?PHART 7.44  ?PCO2ART 48  ?PO2ART 103  ?HCO3 32.6*  ?O2SAT 99.8  ? ? ?Liver Function Tests: ?Recent Labs  ?Lab 12/12/21 ?0403  ?AST 17  ?ALT 7  ?ALKPHOS 145*  ?BILITOT 0.4  ?PROT 7.1  ?ALBUMIN 2.1*  ? ?No results for input(s): LIPASE, AMYLASE in the last 168 hours. ?No results for input(s): AMMONIA in the last 168 hours. ? ?CBC: ?Recent Labs  ?Lab 12/10/21 ?4580 12/12/21 ?0403 12/15/21 ?0422 12/16/21 ?0320  ?WBC 6.9 8.2 9.2 8.0  ?HGB 7.4* 8.0* 7.6* 7.4*  ?HCT 24.1* 25.9* 23.0* 23.1*  ?MCV 94.5 93.8 91.3 92.8  ?PLT 387 451* 311 287   ? ? ?Cardiac Enzymes: ?No results for input(s): CKTOTAL, CKMB, CKMBINDEX, TROPONINI in the last 168 hours. ? ?BNP (last 3 results) ?Recent Labs  ?  12/02/21 ?0736  ?BNP 239.5*  ? ? ?ProBNP (last 3 results) ?No results for input(s): PROBNP in the last 8760 hours. ? ?Radiological Exams: ?No results found. ? ?Assessment/Plan ?Active Problems: ?  Acute on chronic respiratory failure with hypoxia (HCC) ?  Spinal epidural abscess ?  Alcohol withdrawal delirium ([place]) ?  Acute renal failure due to tubular necrosis (HCC) ?  Severe sepsis ([place]) ? ? ?Acute on chronic respiratory failure with hypoxia we will continue with capping trials titrate oxygen as tolerated continue pulmonary toilet ?Spinal epidural abscess no change we will continue to monitor and follow along ?Alcohol withdrawal no change supportive care ?Acute renal failure continue to monitor ?Severe sepsis resolved ? ? ?I have personally seen and evaluated the patient, evaluated laboratory and imaging results, formulated the assessment and plan and placed orders. ?The Patient requires high complexity decision making with multiple systems involvement.  ?Rounds were done with the Respiratory Therapy Director and Staff therapists and discussed with nursing staff also. ? ?  Allyne Gee, MD FCCP ?Pulmonary Critical Care Medicine ?Sleep Medicine  ?

## 2021-12-17 DIAGNOSIS — J9621 Acute and chronic respiratory failure with hypoxia: Secondary | ICD-10-CM | POA: Diagnosis not present

## 2021-12-17 DIAGNOSIS — A419 Sepsis, unspecified organism: Secondary | ICD-10-CM | POA: Diagnosis not present

## 2021-12-17 DIAGNOSIS — N170 Acute kidney failure with tubular necrosis: Secondary | ICD-10-CM | POA: Diagnosis not present

## 2021-12-17 DIAGNOSIS — F10931 Alcohol use, unspecified with withdrawal delirium: Secondary | ICD-10-CM | POA: Diagnosis not present

## 2021-12-18 DIAGNOSIS — N170 Acute kidney failure with tubular necrosis: Secondary | ICD-10-CM | POA: Diagnosis not present

## 2021-12-18 DIAGNOSIS — J9621 Acute and chronic respiratory failure with hypoxia: Secondary | ICD-10-CM | POA: Diagnosis not present

## 2021-12-18 DIAGNOSIS — A419 Sepsis, unspecified organism: Secondary | ICD-10-CM | POA: Diagnosis not present

## 2021-12-18 DIAGNOSIS — F10931 Alcohol use, unspecified with withdrawal delirium: Secondary | ICD-10-CM | POA: Diagnosis not present

## 2021-12-18 LAB — BASIC METABOLIC PANEL
Anion gap: 9 (ref 5–15)
BUN: 15 mg/dL (ref 6–20)
CO2: 26 mmol/L (ref 22–32)
Calcium: 9.3 mg/dL (ref 8.9–10.3)
Chloride: 97 mmol/L — ABNORMAL LOW (ref 98–111)
Creatinine, Ser: 0.56 mg/dL — ABNORMAL LOW (ref 0.61–1.24)
GFR, Estimated: >60 mL/min (ref >60)
Glucose, Bld: 126 mg/dL — ABNORMAL HIGH (ref 70–99)
Potassium: 5 mmol/L (ref 3.5–5.1)
Sodium: 132 mmol/L — ABNORMAL LOW (ref 135–145)

## 2021-12-18 LAB — CBC
HCT: 23.6 % — ABNORMAL LOW (ref 39.0–52.0)
Hemoglobin: 7.9 g/dL — ABNORMAL LOW (ref 13.0–17.0)
MCH: 30 pg (ref 26.0–34.0)
MCHC: 33.5 g/dL (ref 30.0–36.0)
MCV: 89.7 fL (ref 80.0–100.0)
Platelets: 306 10*3/uL (ref 150–400)
RBC: 2.63 MIL/uL — ABNORMAL LOW (ref 4.22–5.81)
RDW: 15.1 % (ref 11.5–15.5)
WBC: 8.4 10*3/uL (ref 4.0–10.5)
nRBC: 0 % (ref 0.0–0.2)

## 2021-12-18 LAB — MAGNESIUM
Magnesium: 1.4 mg/dL — ABNORMAL LOW (ref 1.7–2.4)
Magnesium: 2 mg/dL (ref 1.7–2.4)

## 2021-12-18 NOTE — Progress Notes (Signed)
Pulmonary Critical Care Medicine ?Paul Mckenzie ?  ?PULMONARY CRITICAL CARE SERVICE ? ?PROGRESS NOTE ? ? ? ? ?Paul Mckenzie  ?GGY:[id number]  ?DOB: 28-Nov-1970  ? ?DOA: 12/01/2021 ? ?Referring Physician: Satira Sark, MD ? ?HPI: Paul Mckenzie is a 51 y.o. male being followed for ventilator/airway/oxygen weaning Acute on Chronic Respiratory Failure.  He has been doing very well with the capping trials has been low-grade fever earlier this morning now is afebrile ? ?Medications: ?Reviewed on Rounds ? ?Physical Exam: ? ?Vitals: Tmax 99.6 pulse of 103 respiratory 21 blood pressure 136/83 saturations 95% ? ?Ventilator Settings capping off the vent ? ?General: Comfortable at this time ?Neck: supple ?Cardiovascular: no malignant arrhythmias ?Respiratory: Scattered rhonchi expansion is equal ?Skin: no rash seen on limited exam ?Musculoskeletal: No gross abnormality ?Psychiatric:unable to assess ?Neurologic:no involuntary movements   ?   ?   ?Lab Data:  ? ?Basic Metabolic Panel: ?Recent Labs  ?Lab 12/12/21 ?0403 12/13/21 ?0350 12/14/21 ?0428 12/15/21 ?0422 12/18/21 ?0938  ?NA 139  --   --  133* 132*  ?K 4.2  --   --  4.5 5.0  ?CL 103  --   --  96* 97*  ?CO2 31  --   --  31 26  ?GLUCOSE 117*  --   --  102* 126*  ?BUN 14  --   --  11 15  ?CREATININE 0.56*  --   --  0.60* 0.56*  ?CALCIUM 8.7*  --   --  8.7* 9.3  ?MG 1.6* 1.5* 1.6* 1.8 1.4*  ? ? ?ABG: ?No results for input(s): PHART, PCO2ART, PO2ART, HCO3, O2SAT in the last 168 hours. ? ?Liver Function Tests: ?Recent Labs  ?Lab 12/12/21 ?0403  ?AST 17  ?ALT 7  ?ALKPHOS 145*  ?BILITOT 0.4  ?PROT 7.1  ?ALBUMIN 2.1*  ? ?No results for input(s): LIPASE, AMYLASE in the last 168 hours. ?No results for input(s): AMMONIA in the last 168 hours. ? ?CBC: ?Recent Labs  ?Lab 12/12/21 ?0403 12/15/21 ?0422 12/16/21 ?0320 12/18/21 ?1829  ?WBC 8.2 9.2 8.0 8.4  ?HGB 8.0* 7.6* 7.4* 7.9*  ?HCT 25.9* 23.0* 23.1* 23.6*  ?MCV 93.8 91.3 92.8 89.7  ?PLT 451* 311 287 306   ? ? ?Cardiac Enzymes: ?No results for input(s): CKTOTAL, CKMB, CKMBINDEX, TROPONINI in the last 168 hours. ? ?BNP (last 3 results) ?Recent Labs  ?  12/02/21 ?0736  ?BNP 239.5*  ? ? ?ProBNP (last 3 results) ?No results for input(s): PROBNP in the last 8760 hours. ? ?Radiological Exams: ?No results found. ? ?Assessment/Plan ?Active Problems: ?  Acute on chronic respiratory failure with hypoxia (HCC) ?  Spinal epidural abscess ?  Alcohol withdrawal delirium ([place]) ?  Acute renal failure due to tubular necrosis (HCC) ?  Severe sepsis ([place]) ? ? ?Acute on chronic respiratory failure with hypoxia continue with the weaning to proceed to decannulate today ?Spinal epidural abscess no change we will continue to follow along closely ?Alcohol withdrawal supportive care ?Acute renal failure no change has been stable ?Severe sepsis resolved ? ? ?I have personally seen and evaluated the patient, evaluated laboratory and imaging results, formulated the assessment and plan and placed orders. ?The Patient requires high complexity decision making with multiple systems involvement.  ?Rounds were done with the Respiratory Therapy Director and Staff therapists and discussed with nursing staff also. ? ?Allyne Gee, MD FCCP ?Pulmonary Critical Care Medicine ?Sleep Medicine ? ?

## 2021-12-18 NOTE — Progress Notes (Signed)
Pulmonary Critical Care Medicine ?[place] ?  ?PULMONARY CRITICAL CARE SERVICE ? ?PROGRESS NOTE ? ? ? ? ?Paul Mckenzie  ?[id number]  ?DOB: Jun 19, 1971  ? ?DOA: 12/01/2021 ? ?Referring Physician: Satira Sark, MD ? ?HPI: Paul Mckenzie is a 51 y.o. male being followed for ventilator/airway/oxygen weaning Acute on Chronic Respiratory Failure.  Patient is capping doing actually quite well looking at possibly decannulation tomorrow ? ?Medications: ?Reviewed on Rounds ? ?Physical Exam: ? ?Vitals: Temperature is 99.6 pulse 93 respiratory rate is 19 blood pressure 114/74 saturations 98% ? ?Ventilator Settings capping off the ventilator ? ?General: Comfortable at this time ?Neck: supple ?Cardiovascular: no malignant arrhythmias ?Respiratory: Scattered rhonchi expansion is equal ?Skin: no rash seen on limited exam ?Musculoskeletal: No gross abnormality ?Psychiatric:unable to assess ?Neurologic:no involuntary movements   ?   ?   ?Lab Data:  ? ?Basic Metabolic Panel: ?Recent Labs  ?Lab 12/12/21 ?0403 12/13/21 ?3846 12/14/21 ?0428 12/15/21 ?0422 12/18/21 ?6599  ?NA 139  --   --  133* 132*  ?K 4.2  --   --  4.5 5.0  ?CL 103  --   --  96* 97*  ?CO2 31  --   --  31 26  ?GLUCOSE 117*  --   --  102* 126*  ?BUN 14  --   --  11 15  ?CREATININE 0.56*  --   --  0.60* 0.56*  ?CALCIUM 8.7*  --   --  8.7* 9.3  ?MG 1.6* 1.5* 1.6* 1.8 1.4*  ? ? ?ABG: ?No results for input(s): PHART, PCO2ART, PO2ART, HCO3, O2SAT in the last 168 hours. ? ?Liver Function Tests: ?Recent Labs  ?Lab 12/12/21 ?0403  ?AST 17  ?ALT 7  ?ALKPHOS 145*  ?BILITOT 0.4  ?PROT 7.1  ?ALBUMIN 2.1*  ? ?No results for input(s): LIPASE, AMYLASE in the last 168 hours. ?No results for input(s): AMMONIA in the last 168 hours. ? ?CBC: ?Recent Labs  ?Lab 12/12/21 ?0403 12/15/21 ?0422 12/16/21 ?0320 12/18/21 ?3570  ?WBC 8.2 9.2 8.0 8.4  ?HGB 8.0* 7.6* 7.4* 7.9*  ?HCT 25.9* 23.0* 23.1* 23.6*  ?MCV 93.8 91.3 92.8 89.7  ?PLT 451* 311 287 306   ? ? ?Cardiac Enzymes: ?No results for input(s): CKTOTAL, CKMB, CKMBINDEX, TROPONINI in the last 168 hours. ? ?BNP (last 3 results) ?Recent Labs  ?  12/02/21 ?0736  ?BNP 239.5*  ? ? ?ProBNP (last 3 results) ?No results for input(s): PROBNP in the last 8760 hours. ? ?Radiological Exams: ?No results found. ? ?Assessment/Plan ?Active Problems: ?  Acute on chronic respiratory failure with hypoxia (HCC) ?  Spinal epidural abscess ?  Alcohol withdrawal delirium ([place]) ?  Acute renal failure due to tubular necrosis (HCC) ?  Severe sepsis ([place]) ? ? ?Acute on chronic respiratory failure with hypoxia continue with capping trials patient is on 72-hour goal ?Epidural abscess no change we will continue with supportive care ?Alcohol withdrawal no sign of active withdrawal ?Severe sepsis resolved ?Acute renal failure no change ? ? ?I have personally seen and evaluated the patient, evaluated laboratory and imaging results, formulated the assessment and plan and placed orders. ?The Patient requires high complexity decision making with multiple systems involvement.  ?Rounds were done with the Respiratory Therapy Director and Staff therapists and discussed with nursing staff also. ? ?Allyne Gee, MD FCCP ?Pulmonary Critical Care Medicine ?Sleep Medicine ? ?

## 2021-12-19 DIAGNOSIS — A419 Sepsis, unspecified organism: Secondary | ICD-10-CM | POA: Diagnosis not present

## 2021-12-19 DIAGNOSIS — J9621 Acute and chronic respiratory failure with hypoxia: Secondary | ICD-10-CM | POA: Diagnosis not present

## 2021-12-19 DIAGNOSIS — N170 Acute kidney failure with tubular necrosis: Secondary | ICD-10-CM | POA: Diagnosis not present

## 2021-12-19 DIAGNOSIS — F10931 Alcohol use, unspecified with withdrawal delirium: Secondary | ICD-10-CM | POA: Diagnosis not present

## 2021-12-19 NOTE — Progress Notes (Signed)
Pulmonary Critical Care Medicine ?[place] ?  ?PULMONARY CRITICAL CARE SERVICE ? ?PROGRESS NOTE ? ? ? ? ?Paul Mckenzie  ?[id number]  ?DOB: 1971/01/30  ? ?DOA: 12/01/2021 ? ?Referring Physician: Satira Sark, MD ? ?HPI: Paul Mckenzie is a 51 y.o. male being followed for ventilator/airway/oxygen weaning Acute on Chronic Respiratory Failure.  Decannulated doing fairly well oxygen weaned down to room air now ? ?Medications: ?Reviewed on Rounds ? ?Physical Exam: ? ?Vitals: Temperature is 99.0 pulse 98 respiratory 21 blood pressure 120/75 saturations 98% ? ?Ventilator Settings on room air decannulated ? ?General: Comfortable at this time ?Neck: supple ?Cardiovascular: no malignant arrhythmias ?Respiratory: Scattered rhonchi expansion is equal ?Skin: no rash seen on limited exam ?Musculoskeletal: No gross abnormality ?Psychiatric:unable to assess ?Neurologic:no involuntary movements   ?   ?   ?Lab Data:  ? ?Basic Metabolic Panel: ?Recent Labs  ?Lab 12/13/21 ?0947 12/14/21 ?0428 12/15/21 ?0422 12/18/21 ?0442 12/18/21 ?1901  ?NA  --   --  133* 132*  --   ?K  --   --  4.5 5.0  --   ?CL  --   --  96* 97*  --   ?CO2  --   --  31 26  --   ?GLUCOSE  --   --  102* 126*  --   ?BUN  --   --  11 15  --   ?CREATININE  --   --  0.60* 0.56*  --   ?CALCIUM  --   --  8.7* 9.3  --   ?MG 1.5* 1.6* 1.8 1.4* 2.0  ? ? ?ABG: ?No results for input(s): PHART, PCO2ART, PO2ART, HCO3, O2SAT in the last 168 hours. ? ?Liver Function Tests: ?No results for input(s): AST, ALT, ALKPHOS, BILITOT, PROT, ALBUMIN in the last 168 hours. ?No results for input(s): LIPASE, AMYLASE in the last 168 hours. ?No results for input(s): AMMONIA in the last 168 hours. ? ?CBC: ?Recent Labs  ?Lab 12/15/21 ?0422 12/16/21 ?0320 12/18/21 ?0962  ?WBC 9.2 8.0 8.4  ?HGB 7.6* 7.4* 7.9*  ?HCT 23.0* 23.1* 23.6*  ?MCV 91.3 92.8 89.7  ?PLT 311 287 306  ? ? ?Cardiac Enzymes: ?No results for input(s): CKTOTAL, CKMB, CKMBINDEX, TROPONINI in the  last 168 hours. ? ?BNP (last 3 results) ?Recent Labs  ?  12/02/21 ?0736  ?BNP 239.5*  ? ? ?ProBNP (last 3 results) ?No results for input(s): PROBNP in the last 8760 hours. ? ?Radiological Exams: ?No results found. ? ?Assessment/Plan ?Active Problems: ?  Acute on chronic respiratory failure with hypoxia (HCC) ?  Spinal epidural abscess ?  Alcohol withdrawal delirium ([place]) ?  Acute renal failure due to tubular necrosis (HCC) ?  Severe sepsis ([place]) ? ? ?Acute on chronic respiratory failure hypoxia we will continue with supportive care oxygen as needed continue to monitor closely ?Epidural abscess has been treated continue with supportive care ?Alcohol withdrawal resolved ?Acute renal failure improved ?Severe sepsis resolved ? ? ?I have personally seen and evaluated the patient, evaluated laboratory and imaging results, formulated the assessment and plan and placed orders. ?The Patient requires high complexity decision making with multiple systems involvement.  ?Rounds were done with the Respiratory Therapy Director and Staff therapists and discussed with nursing staff also. ? ?Allyne Gee, MD FCCP ?Pulmonary Critical Care Medicine ?Sleep Medicine ? ?

## 2021-12-22 ENCOUNTER — Other Ambulatory Visit (HOSPITAL_COMMUNITY): Payer: Self-pay

## 2021-12-22 LAB — BASIC METABOLIC PANEL
Anion gap: 9 (ref 5–15)
BUN: 25 mg/dL — ABNORMAL HIGH (ref 6–20)
CO2: 26 mmol/L (ref 22–32)
Calcium: 9.6 mg/dL (ref 8.9–10.3)
Chloride: 98 mmol/L (ref 98–111)
Creatinine, Ser: 0.65 mg/dL (ref 0.61–1.24)
GFR, Estimated: >60 mL/min (ref >60)
Glucose, Bld: 111 mg/dL — ABNORMAL HIGH (ref 70–99)
Potassium: 5.3 mmol/L — ABNORMAL HIGH (ref 3.5–5.1)
Sodium: 133 mmol/L — ABNORMAL LOW (ref 135–145)

## 2021-12-22 LAB — CBC
HCT: 23.2 % — ABNORMAL LOW (ref 39.0–52.0)
Hemoglobin: 7.7 g/dL — ABNORMAL LOW (ref 13.0–17.0)
MCH: 29.6 pg (ref 26.0–34.0)
MCHC: 33.2 g/dL (ref 30.0–36.0)
MCV: 89.2 fL (ref 80.0–100.0)
Platelets: 389 10*3/uL (ref 150–400)
RBC: 2.6 MIL/uL — ABNORMAL LOW (ref 4.22–5.81)
RDW: 14.6 % (ref 11.5–15.5)
WBC: 6.3 10*3/uL (ref 4.0–10.5)
nRBC: 0 % (ref 0.0–0.2)

## 2021-12-22 LAB — MAGNESIUM: Magnesium: 1.5 mg/dL — ABNORMAL LOW (ref 1.7–2.4)

## 2021-12-22 IMAGING — MR MR CERVICAL SPINE W/O CM
4 of 5 series · 16 of 48 positions shown · non-contrast
Comparison: [DATE] cervical and thoracic MRI, no prior lumbar
spine MRI, correlation is made with lumbar spine radiographs
[DATE]

CLINICAL DATA: Epidural abscess C2-L1

EXAM:
MRI CERVICAL, THORACIC AND LUMBAR SPINE WITHOUT CONTRAST
TECHNIQUE: Multiplanar and multiecho pulse sequences of the cervical spine, to
include the craniocervical junction and cervicothoracic junction,
and thoracic and lumbar spine, were obtained without intravenous
contrast.

[Series 2: T2 · sagittal · 3.0mm · 0.43mm/px · 4 of 15 slices shown (1 of 2)]
[im 1/15]
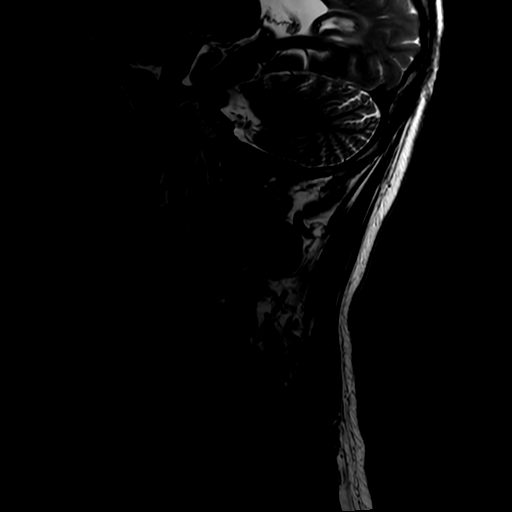
[im 5/15]
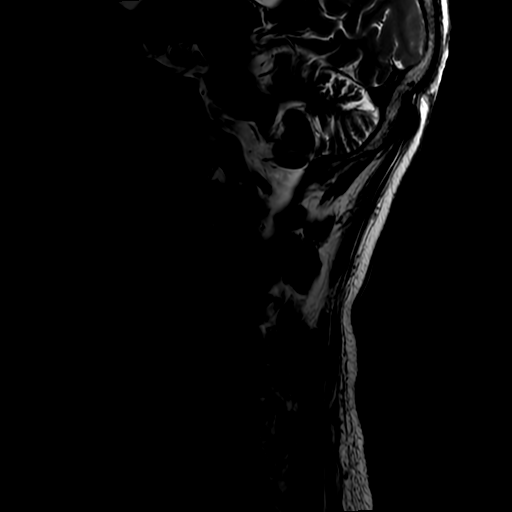
[im 10/15]
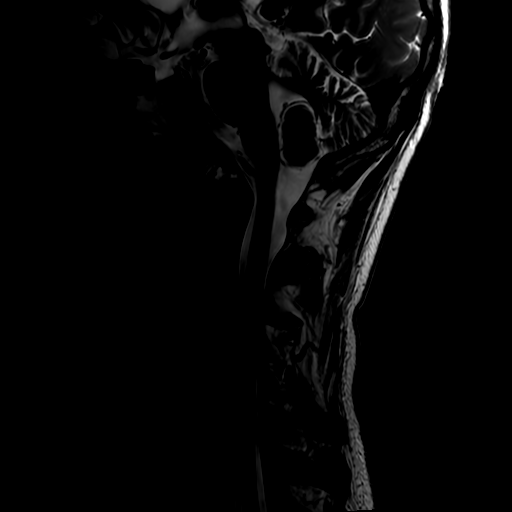
[im 15/15]
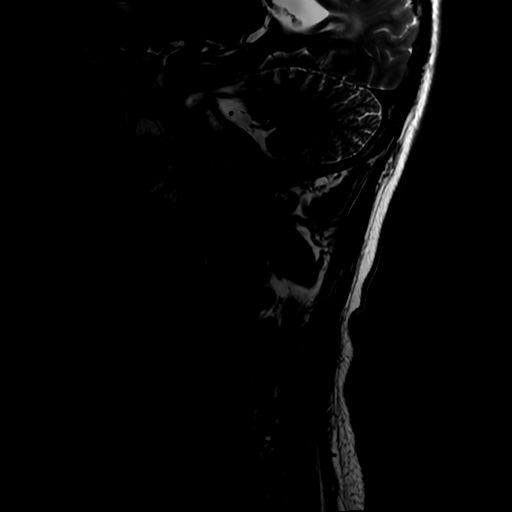

[Series 3: FLAIR · sagittal · 3.0mm · 0.43mm/px · 3 of 15 slices shown]
[im 1/15]
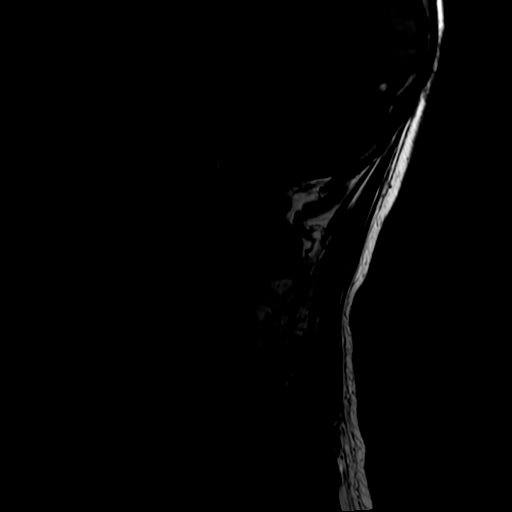
[im 8/15]
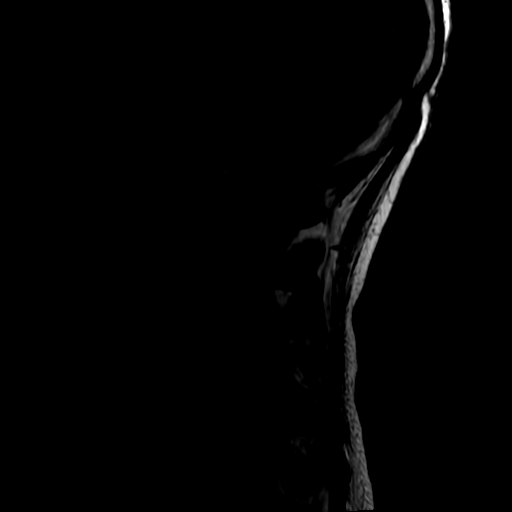
[im 15/15]
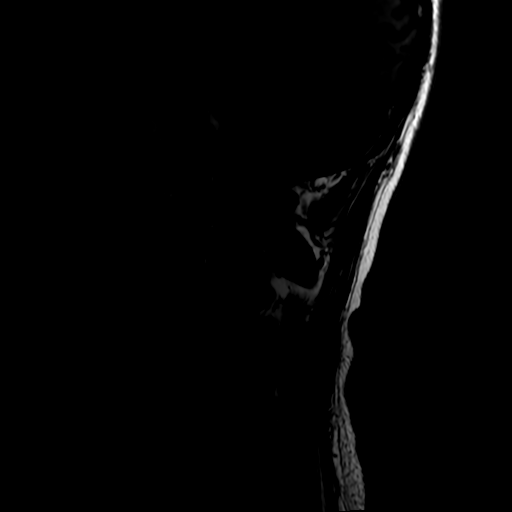

[Series 4: STIR · sagittal · 3.0mm · 0.43mm/px · 3 of 15 slices shown]
[im 1/15]
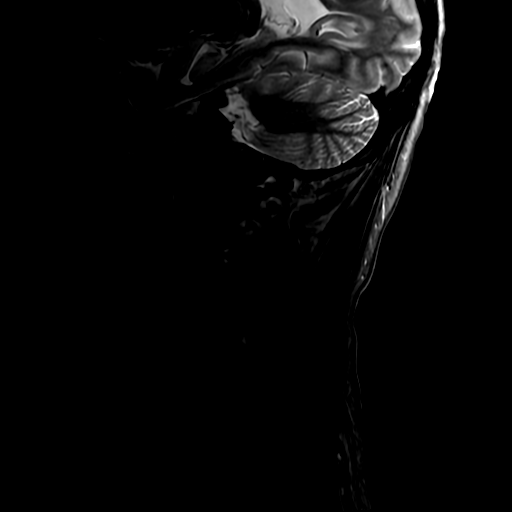
[im 8/15]
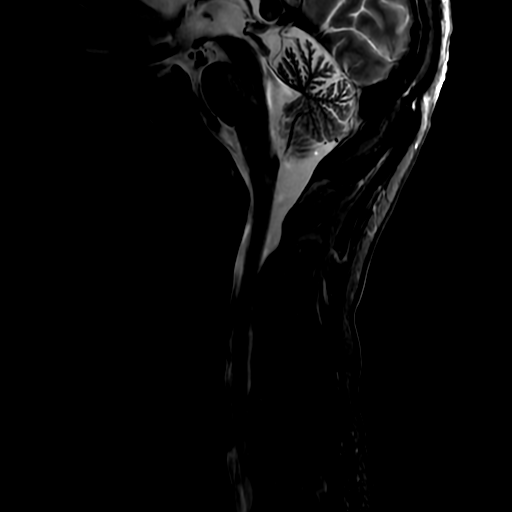
[im 15/15]
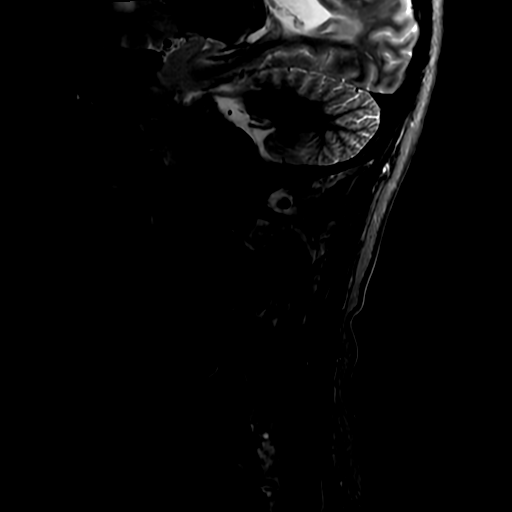

[Series 6: T2 · axial · 3.0mm · 0.35mm/px · z∈[-108,+7]mm · 6 of 41 slices shown (2 of 2)]
[im 1/41]
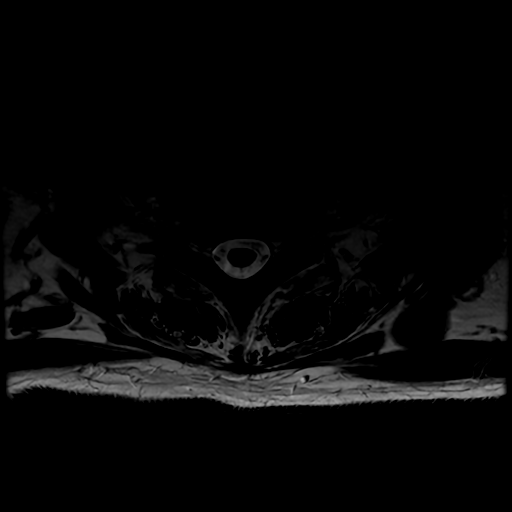
[im 5/41]
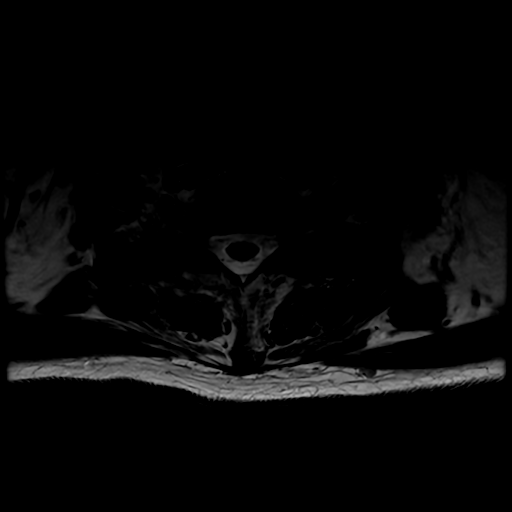
[im 14/41]
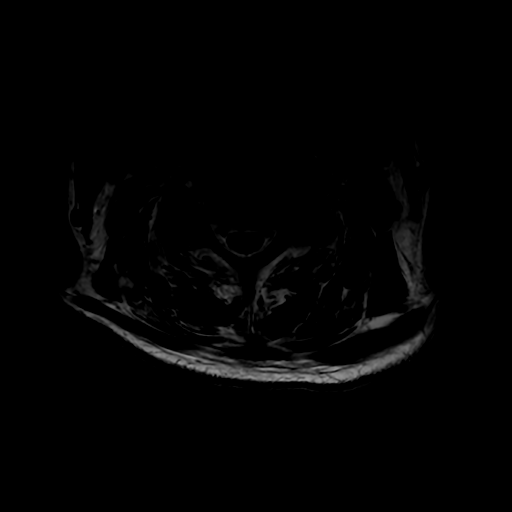
[im 18/41]
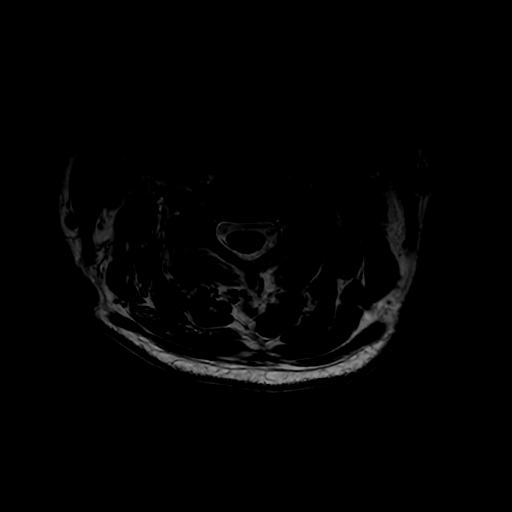
[im 23/41]
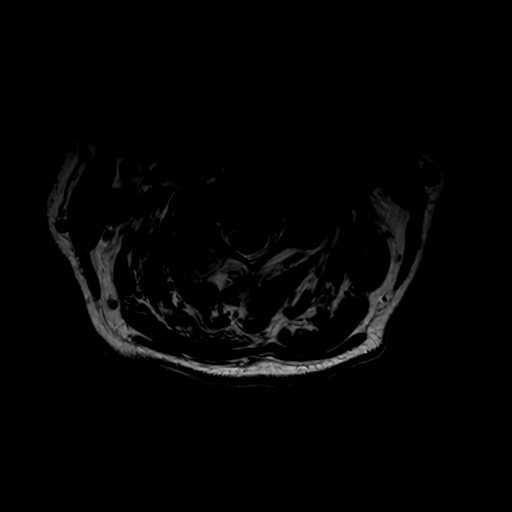
[im 36/41]
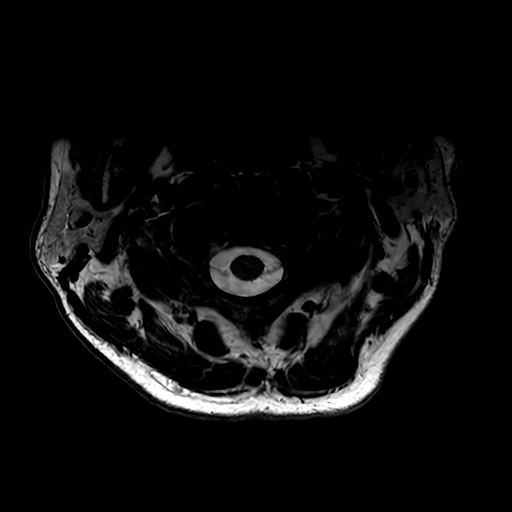

[16 of 48 positions shown; findings below may reference images not displayed]

FINDINGS: MRI CERVICAL SPINE FINDINGS

Alignment: Mild straightening of the normal cervical lordosis. No
listhesis.

Vertebrae: Redemonstrated increased T1 and T2 signal in the
vertebral bodies of C5-C7, which appears similar to the prior exam.
No acute fracture. Diffusely decreased marrow signal.

Cord: Normal signal and morphology. Previously noted epidural
collection anterior to the thecal sac is no longer seen in the
cervical spine.

Posterior Fossa, vertebral arteries, paraspinal tissues: Previously
noted edema in the paraspinous muscles is decreased.

Disc levels:

C2-C3: No significant disc bulge. Uncovertebral and facet
arthropathy. No spinal canal stenosis. Severe right neural foraminal
narrowing.

C3-C4: Right foraminal disc protrusion. Uncovertebral and facet
arthropathy. No spinal canal stenosis. Severe right and moderate
left neural foraminal narrowing.

C4-C5: No significant disc bulge. Uncovertebral and facet
arthropathy. Mild-to-moderate bilateral neural foraminal narrowing.
No spinal canal stenosis.

C5-C6: Disc height loss with disc osteophyte complex. Uncovertebral
and facet arthropathy. No spinal canal stenosis. Severe left and
moderate right neural foraminal narrowing.

C6-C7: Disc height loss with disc osteophyte complex. Uncovertebral
and facet arthropathy. No spinal canal stenosis. Severe left and
moderate right neural foraminal narrowing.

C7-T1: No significant disc bulge. No spinal canal stenosis or
neuroforaminal narrowing.

MRI THORACIC SPINE FINDINGS

Alignment:  Physiologic.

Vertebrae: Diffusely decreased marrow signal. No acute fracture or
suspicious osseous lesion.

Cord: Previously noted extensive anterior epidural collection seen
throughout the thoracic spine is no longer seen. Cord is normal in
signal and morphology.

Paraspinal and other soft tissues: Previously noted extensive
consolidation in the right lung is no longer seen.

Disc levels:

No spinal canal stenosis or significant neural foraminal narrowing.

MRI LUMBAR SPINE FINDINGS

Segmentation:  Standard.

Alignment:  No listhesis.

Vertebrae: Diffusely decreased marrow signal. Increased T1 and T2
signal at L5-S1 and, to a lesser extent L4-L5, likely related to
prior osteomyelitis, with advanced degenerative changes compared to
the [DATE] radiographs. Diffusely decreased marrow signal.
Congenitally short pedicles, which narrow the AP diameter of the
spinal canal.

Conus medullaris and cauda equina: Conus extends to the L1-L2 level.
There is some clumping of the nerve roots at L4 and L5. Conus and
cauda equina appear otherwise normal. Possible small epidural
collection at the posterior aspect of L4 and L5. Previously noted
epidural collection is otherwise resolved.

Paraspinal and other soft tissues: Increased T2 signal in the medial
aspect of the psoas, likely edema. No focal collection is seen.

Disc levels:

T12-L1: No significant disc bulge. No spinal canal stenosis or
neural foraminal narrowing.

L1-L2: No significant disc bulge. Mild facet arthropathy. Mild
spinal canal stenosis. No neural foraminal narrowing.

L2-L3: No significant disc bulge. Mild facet arthropathy. No spinal
canal stenosis or neural foraminal narrowing.

L3-L4: No significant disc bulge. Mild facet arthropathy. No spinal
canal stenosis. Mild right and moderate left neural foraminal
narrowing.

L4-L5: Epidural collection along the posterior aspect of L4 measures
up to 7 mm. Disc height loss and disc osteophyte complex.
Mild-to-moderate facet arthropathy. No spinal canal stenosis.
Moderate to severe left and moderate right neural foraminal
narrowing.

L5-S1: Disc height loss and disc osteophyte complex. Ventral
collection measures up to 5 mm (series 19, image 33). No spinal
canal stenosis. Moderate bilateral neural foraminal narrowing.
IMPRESSION: 1. Significant interval improvement in the size of a ventral
epidural collection, now only seen posterior to the L4 and L5
vertebral bodies. Although lumbar levels were not imaged previously,
there is evidence of discitis osteomyelitis at L4-L5 and L5-S1, with
endplate irregularity, as well as degenerative changes that have
advanced compared to the prior radiograph and edema in the adjacent
musculature. In addition there is clumping of the nerve roots at L4
and L5, concerning for arachnoiditis.
2. Marrow edema C5-C7 appear similar to the prior exam. Edema in the
cervical paraspinous muscles has improved.
3. Multilevel neural foraminal narrowing in the cervical spine and
lumbar spine, with mild spinal canal stenosis at L2-L3.
4. Diffusely decreased marrow signal, which is nonspecific; although
this can be caused by an infiltrative marrow process, the most
common causes include anemia, obesity, or tobacco use.

## 2021-12-22 IMAGING — MR MR THORACIC SPINE W/O CM
4 of 6 series · 16 of 48 positions shown · non-contrast
Comparison: [DATE] cervical and thoracic MRI, no prior lumbar
spine MRI, correlation is made with lumbar spine radiographs
[DATE]

CLINICAL DATA: Epidural abscess C2-L1

EXAM:
MRI CERVICAL, THORACIC AND LUMBAR SPINE WITHOUT CONTRAST
TECHNIQUE: Multiplanar and multiecho pulse sequences of the cervical spine, to
include the craniocervical junction and cervicothoracic junction,
and thoracic and lumbar spine, were obtained without intravenous
contrast.

[Series 8: T1 · sagittal · 3.0mm · 0.90mm/px · 3 of 12 slices shown (1 of 2)]
[im 1/12]
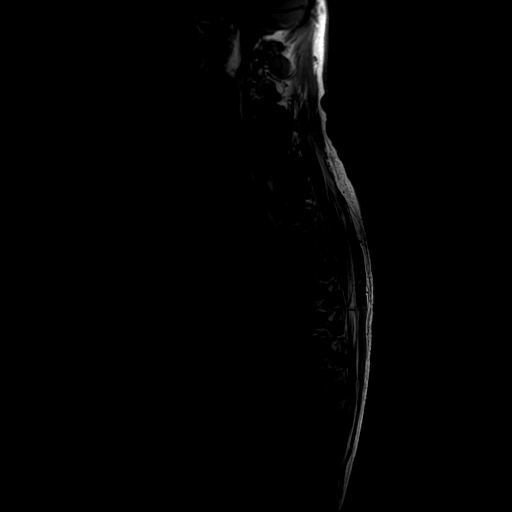
[im 6/12]
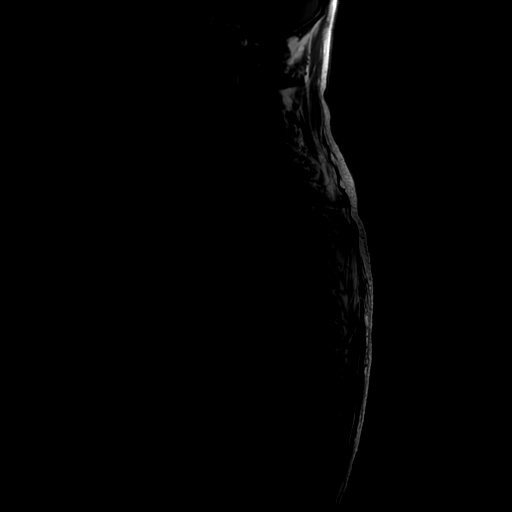
[im 12/12]
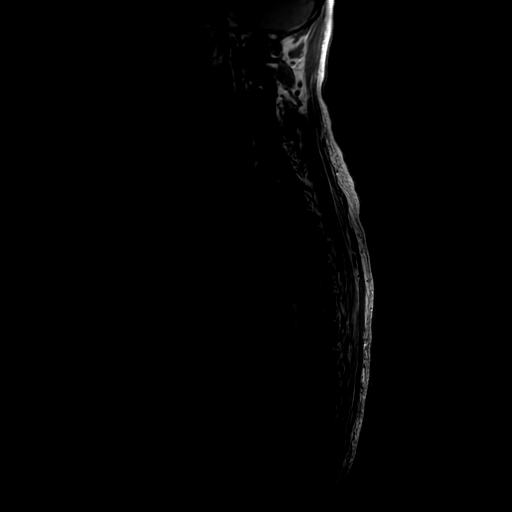

[Series 11: T2 · sagittal · 3.0mm · 0.66mm/px · 5 of 15 slices shown (1 of 2)]
[im 1/15]
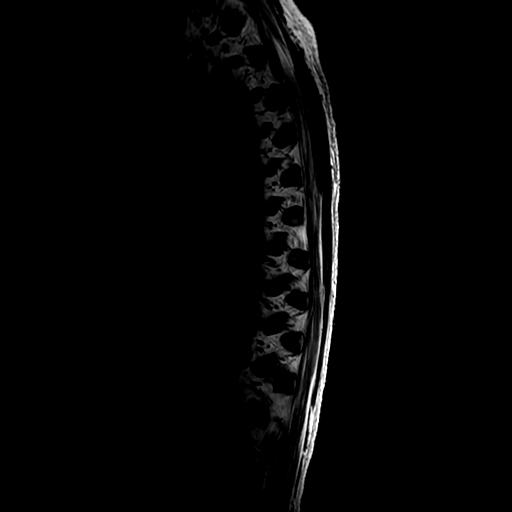
[im 4/15]
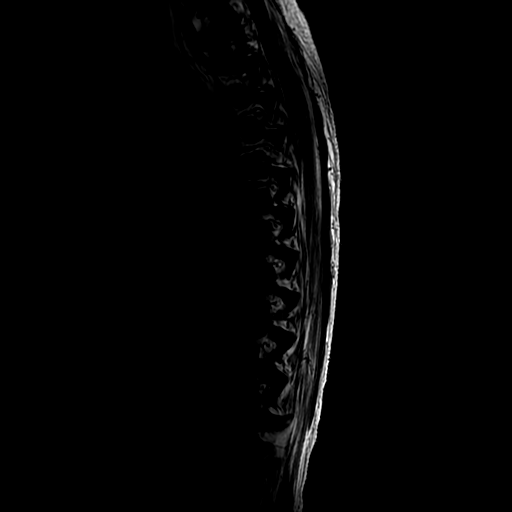
[im 8/15]
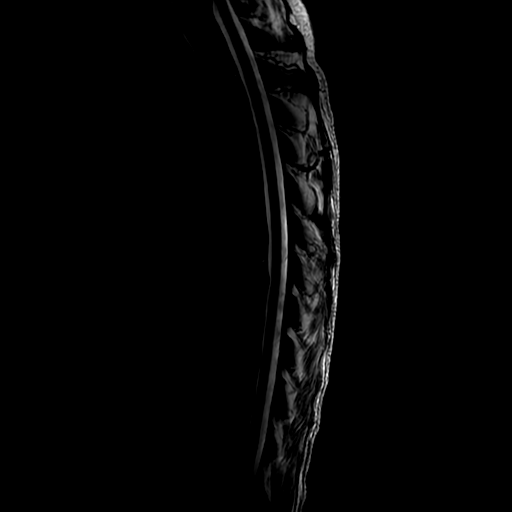
[im 11/15]
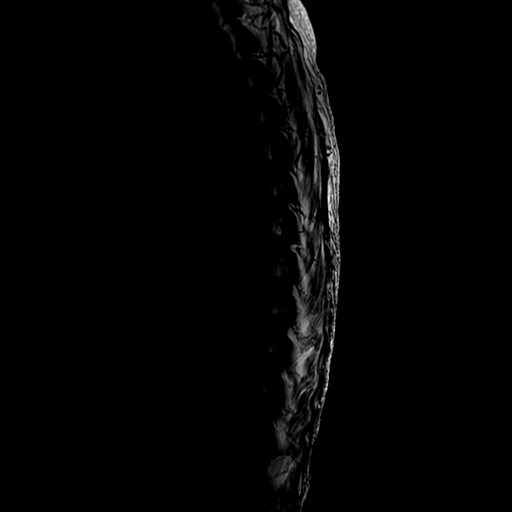
[im 15/15]
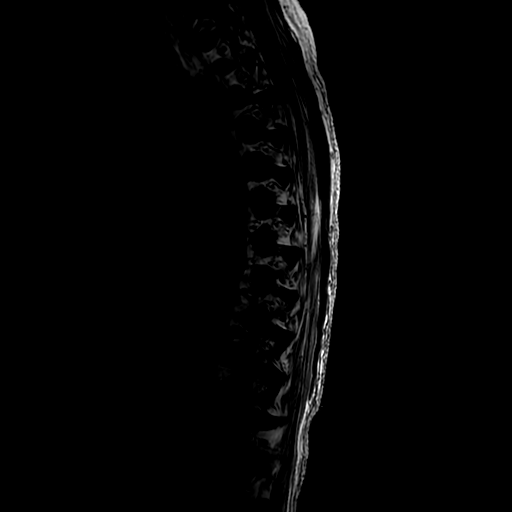

[Series 12: T1 · sagittal · 3.0mm · 0.66mm/px · 3 of 15 slices shown (2 of 2)]
[im 1/15]
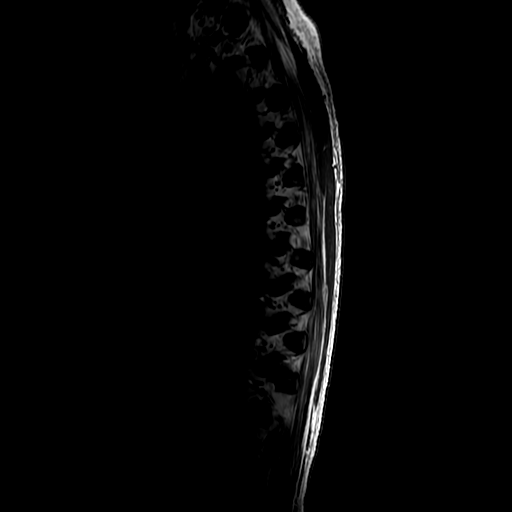
[im 8/15]
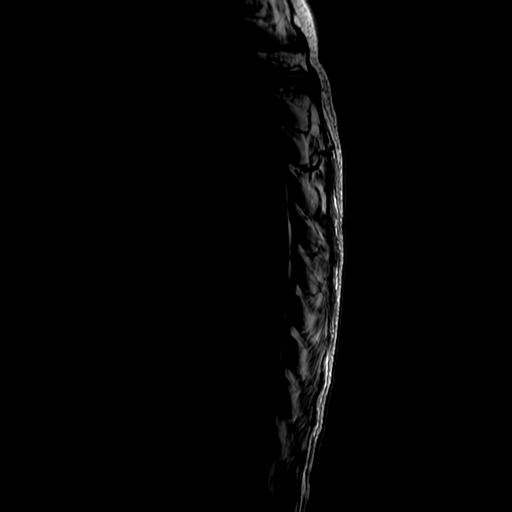
[im 15/15]
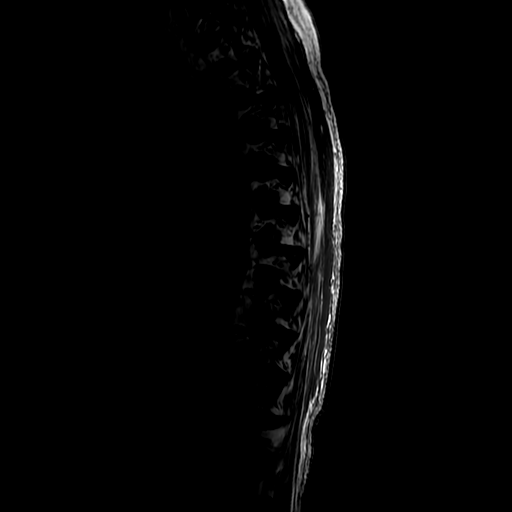

[Series 13: T2 · axial · 4.0mm · 0.39mm/px · z∈[-369,-138]mm · 5 of 39 slices shown (2 of 2)]
[im 1/39]
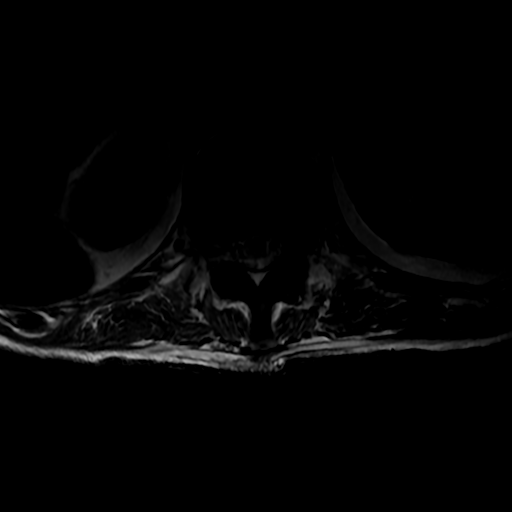
[im 6/39]
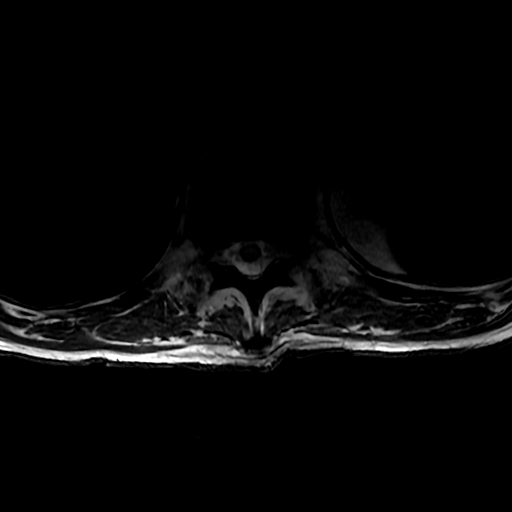
[im 12/39]
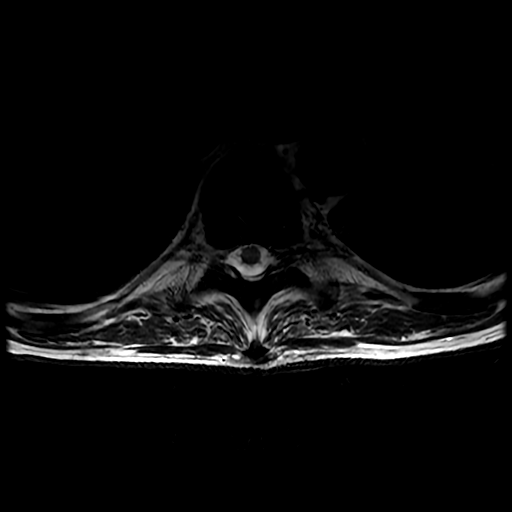
[im 21/39]
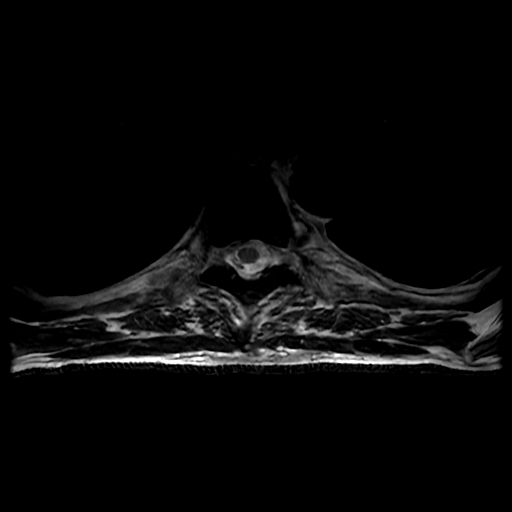
[im 33/39]
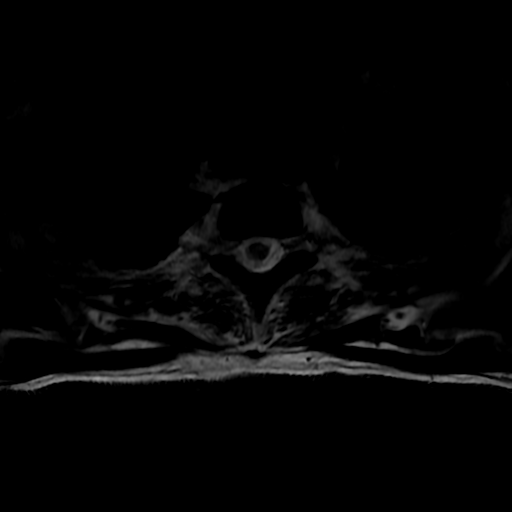

[16 of 48 positions shown; findings below may reference images not displayed]

FINDINGS: MRI CERVICAL SPINE FINDINGS

Alignment: Mild straightening of the normal cervical lordosis. No
listhesis.

Vertebrae: Redemonstrated increased T1 and T2 signal in the
vertebral bodies of C5-C7, which appears similar to the prior exam.
No acute fracture. Diffusely decreased marrow signal.

Cord: Normal signal and morphology. Previously noted epidural
collection anterior to the thecal sac is no longer seen in the
cervical spine.

Posterior Fossa, vertebral arteries, paraspinal tissues: Previously
noted edema in the paraspinous muscles is decreased.

Disc levels:

C2-C3: No significant disc bulge. Uncovertebral and facet
arthropathy. No spinal canal stenosis. Severe right neural foraminal
narrowing.

C3-C4: Right foraminal disc protrusion. Uncovertebral and facet
arthropathy. No spinal canal stenosis. Severe right and moderate
left neural foraminal narrowing.

C4-C5: No significant disc bulge. Uncovertebral and facet
arthropathy. Mild-to-moderate bilateral neural foraminal narrowing.
No spinal canal stenosis.

C5-C6: Disc height loss with disc osteophyte complex. Uncovertebral
and facet arthropathy. No spinal canal stenosis. Severe left and
moderate right neural foraminal narrowing.

C6-C7: Disc height loss with disc osteophyte complex. Uncovertebral
and facet arthropathy. No spinal canal stenosis. Severe left and
moderate right neural foraminal narrowing.

C7-T1: No significant disc bulge. No spinal canal stenosis or
neuroforaminal narrowing.

MRI THORACIC SPINE FINDINGS

Alignment:  Physiologic.

Vertebrae: Diffusely decreased marrow signal. No acute fracture or
suspicious osseous lesion.

Cord: Previously noted extensive anterior epidural collection seen
throughout the thoracic spine is no longer seen. Cord is normal in
signal and morphology.

Paraspinal and other soft tissues: Previously noted extensive
consolidation in the right lung is no longer seen.

Disc levels:

No spinal canal stenosis or significant neural foraminal narrowing.

MRI LUMBAR SPINE FINDINGS

Segmentation:  Standard.

Alignment:  No listhesis.

Vertebrae: Diffusely decreased marrow signal. Increased T1 and T2
signal at L5-S1 and, to a lesser extent L4-L5, likely related to
prior osteomyelitis, with advanced degenerative changes compared to
the [DATE] radiographs. Diffusely decreased marrow signal.
Congenitally short pedicles, which narrow the AP diameter of the
spinal canal.

Conus medullaris and cauda equina: Conus extends to the L1-L2 level.
There is some clumping of the nerve roots at L4 and L5. Conus and
cauda equina appear otherwise normal. Possible small epidural
collection at the posterior aspect of L4 and L5. Previously noted
epidural collection is otherwise resolved.

Paraspinal and other soft tissues: Increased T2 signal in the medial
aspect of the psoas, likely edema. No focal collection is seen.

Disc levels:

T12-L1: No significant disc bulge. No spinal canal stenosis or
neural foraminal narrowing.

L1-L2: No significant disc bulge. Mild facet arthropathy. Mild
spinal canal stenosis. No neural foraminal narrowing.

L2-L3: No significant disc bulge. Mild facet arthropathy. No spinal
canal stenosis or neural foraminal narrowing.

L3-L4: No significant disc bulge. Mild facet arthropathy. No spinal
canal stenosis. Mild right and moderate left neural foraminal
narrowing.

L4-L5: Epidural collection along the posterior aspect of L4 measures
up to 7 mm. Disc height loss and disc osteophyte complex.
Mild-to-moderate facet arthropathy. No spinal canal stenosis.
Moderate to severe left and moderate right neural foraminal
narrowing.

L5-S1: Disc height loss and disc osteophyte complex. Ventral
collection measures up to 5 mm (series 19, image 33). No spinal
canal stenosis. Moderate bilateral neural foraminal narrowing.
IMPRESSION: 1. Significant interval improvement in the size of a ventral
epidural collection, now only seen posterior to the L4 and L5
vertebral bodies. Although lumbar levels were not imaged previously,
there is evidence of discitis osteomyelitis at L4-L5 and L5-S1, with
endplate irregularity, as well as degenerative changes that have
advanced compared to the prior radiograph and edema in the adjacent
musculature. In addition there is clumping of the nerve roots at L4
and L5, concerning for arachnoiditis.
2. Marrow edema C5-C7 appear similar to the prior exam. Edema in the
cervical paraspinous muscles has improved.
3. Multilevel neural foraminal narrowing in the cervical spine and
lumbar spine, with mild spinal canal stenosis at L2-L3.
4. Diffusely decreased marrow signal, which is nonspecific; although
this can be caused by an infiltrative marrow process, the most
common causes include anemia, obesity, or tobacco use.

## 2021-12-22 IMAGING — MR MR LUMBAR SPINE W/O CM
4 of 5 series · 16 of 48 positions shown · non-contrast
Comparison: [DATE] cervical and thoracic MRI, no prior lumbar
spine MRI, correlation is made with lumbar spine radiographs
[DATE]

CLINICAL DATA: Epidural abscess C2-L1

EXAM:
MRI CERVICAL, THORACIC AND LUMBAR SPINE WITHOUT CONTRAST
TECHNIQUE: Multiplanar and multiecho pulse sequences of the cervical spine, to
include the craniocervical junction and cervicothoracic junction,
and thoracic and lumbar spine, were obtained without intravenous
contrast.

[Series 16: T2 · sagittal · 4.0mm · 0.55mm/px · 5 of 15 slices shown (1 of 2)]
[im 1/15]
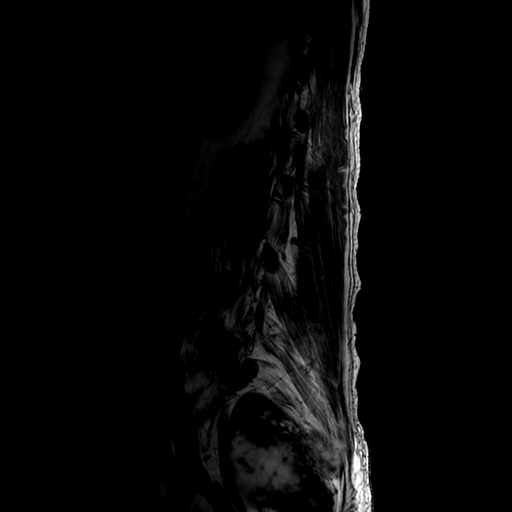
[im 4/15]
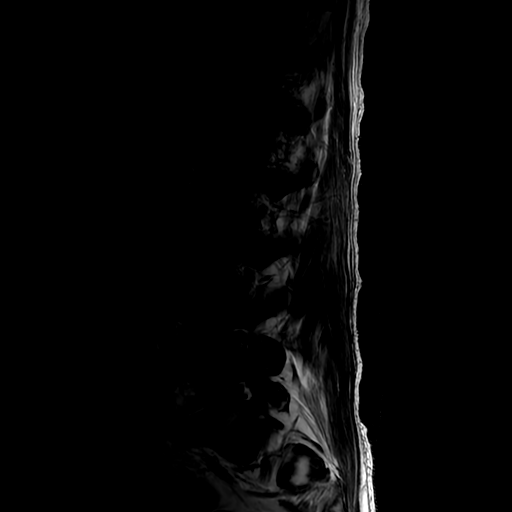
[im 8/15]
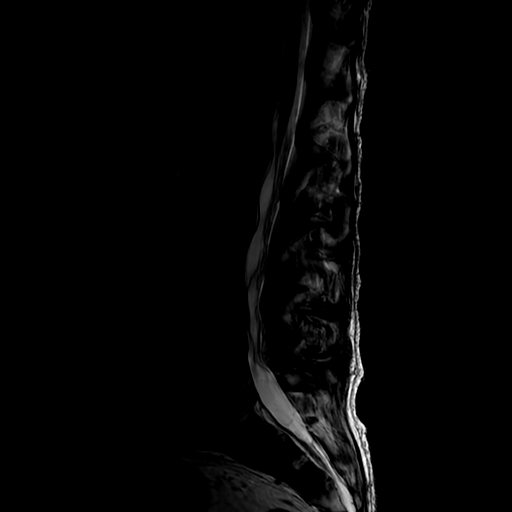
[im 11/15]
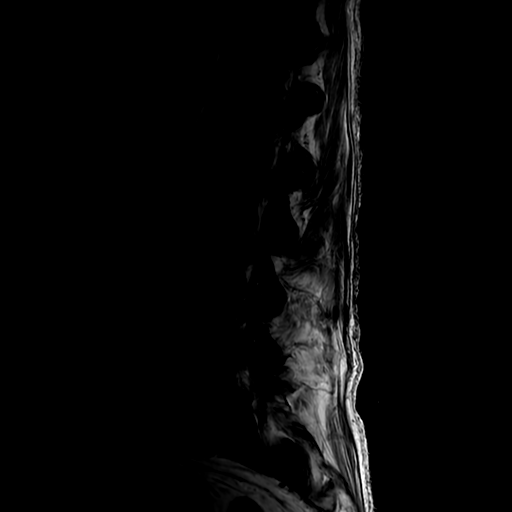
[im 15/15]
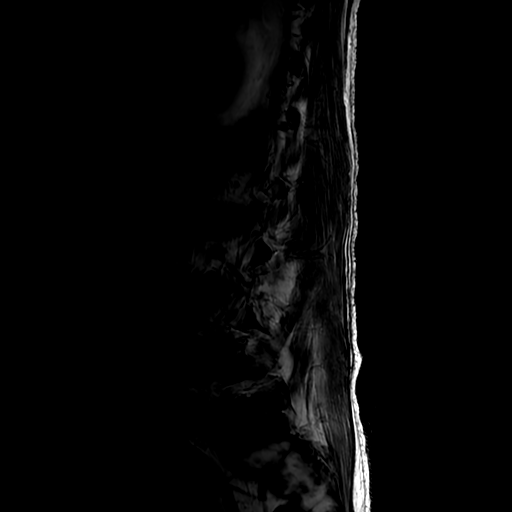

[Series 18: T1 · sagittal · 4.0mm · 0.55mm/px · 3 of 15 slices shown (1 of 2)]
[im 3/15]
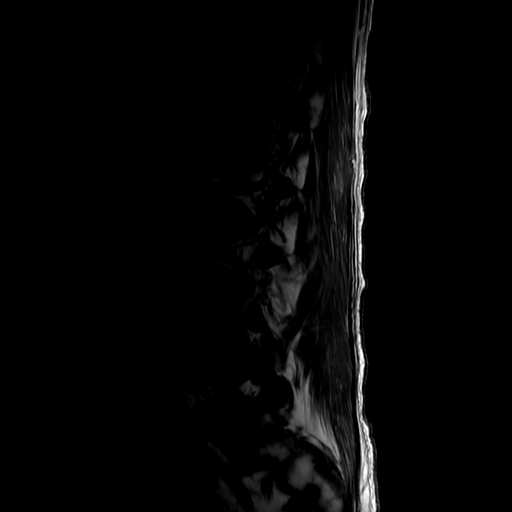
[im 9/15]
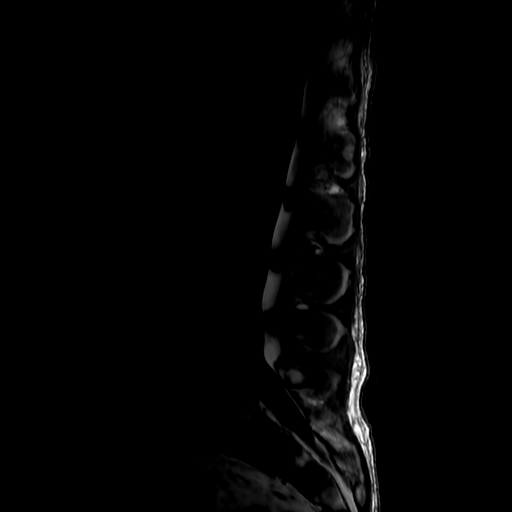
[im 15/15]
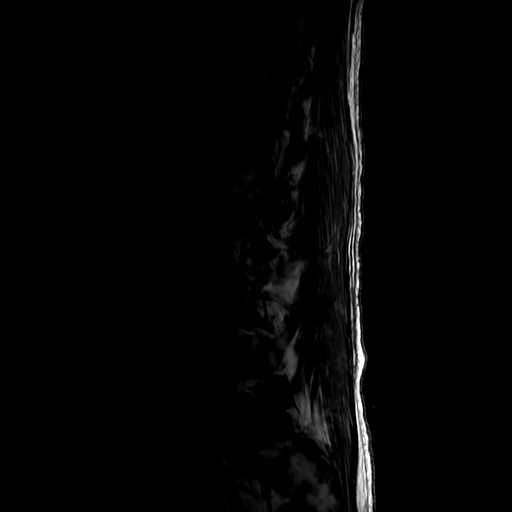

[Series 19: T2 · axial · 4.0mm · 0.39mm/px · z∈[-557,-392]mm · 5 of 42 slices shown (2 of 2)]
[im 3/42]
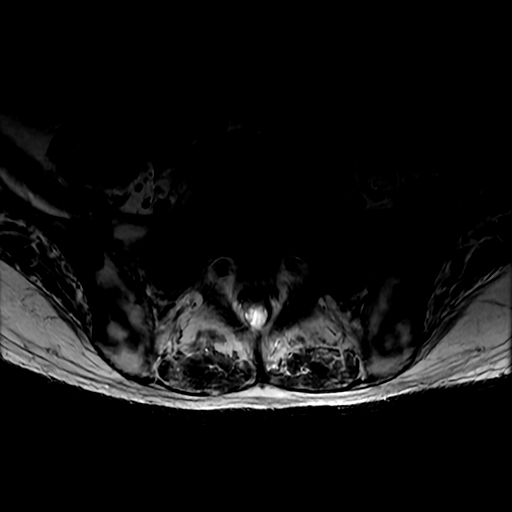
[im 6/42]
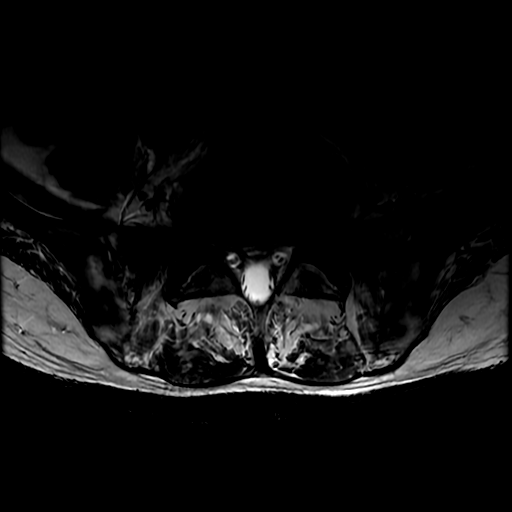
[im 9/42]
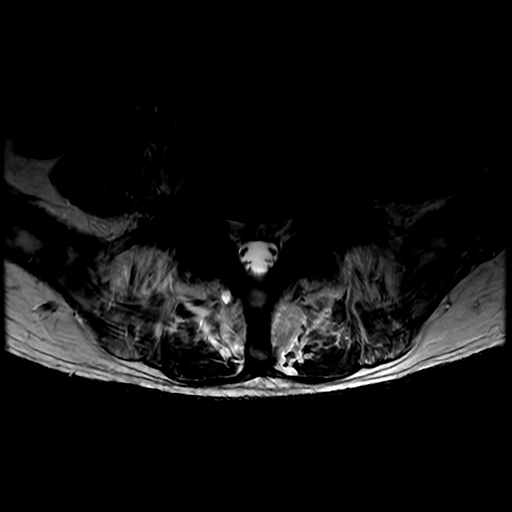
[im 22/42]
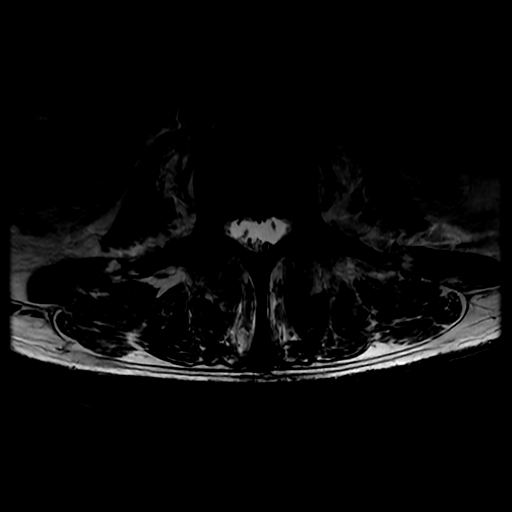
[im 36/42]
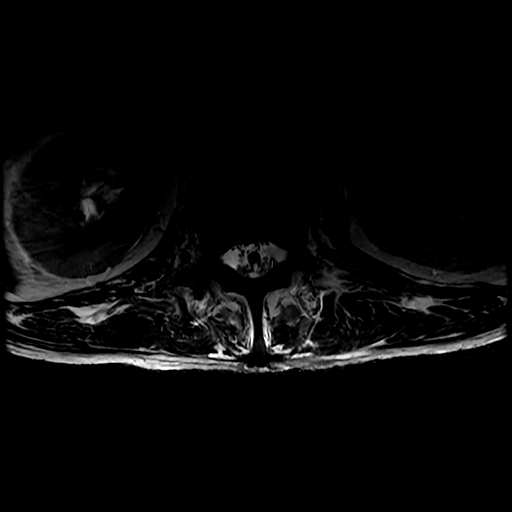

[Series 20: T1 · axial · 4.0mm · 0.39mm/px · z∈[-542,-392]mm · 3 of 42 slices shown (2 of 2)]
[im 6/42]
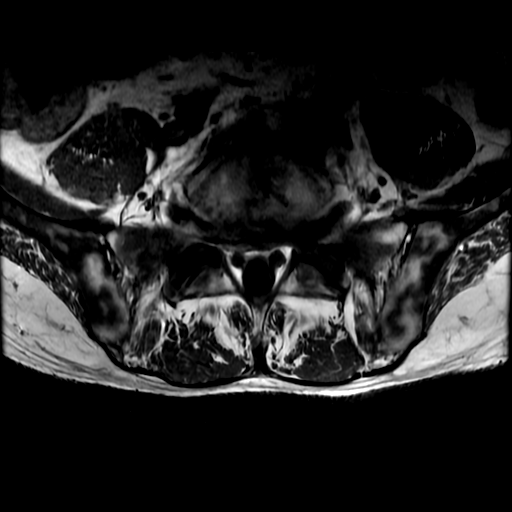
[im 22/42]
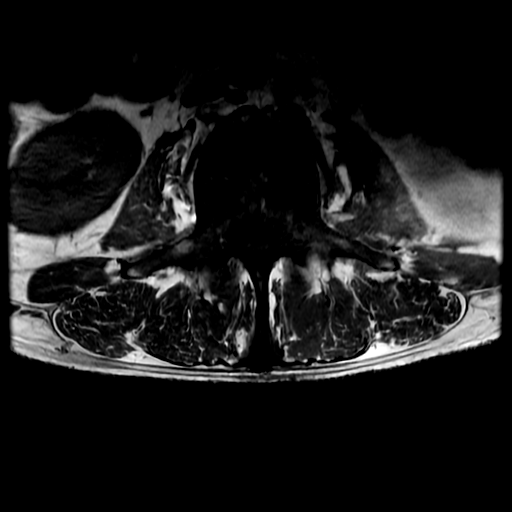
[im 36/42]
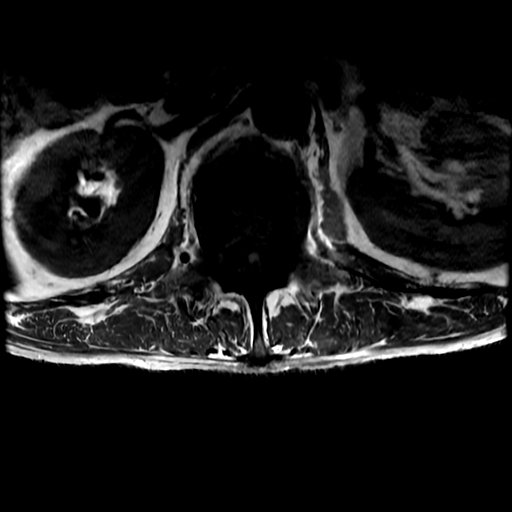

[16 of 48 positions shown; findings below may reference images not displayed]

FINDINGS: MRI CERVICAL SPINE FINDINGS

Alignment: Mild straightening of the normal cervical lordosis. No
listhesis.

Vertebrae: Redemonstrated increased T1 and T2 signal in the
vertebral bodies of C5-C7, which appears similar to the prior exam.
No acute fracture. Diffusely decreased marrow signal.

Cord: Normal signal and morphology. Previously noted epidural
collection anterior to the thecal sac is no longer seen in the
cervical spine.

Posterior Fossa, vertebral arteries, paraspinal tissues: Previously
noted edema in the paraspinous muscles is decreased.

Disc levels:

C2-C3: No significant disc bulge. Uncovertebral and facet
arthropathy. No spinal canal stenosis. Severe right neural foraminal
narrowing.

C3-C4: Right foraminal disc protrusion. Uncovertebral and facet
arthropathy. No spinal canal stenosis. Severe right and moderate
left neural foraminal narrowing.

C4-C5: No significant disc bulge. Uncovertebral and facet
arthropathy. Mild-to-moderate bilateral neural foraminal narrowing.
No spinal canal stenosis.

C5-C6: Disc height loss with disc osteophyte complex. Uncovertebral
and facet arthropathy. No spinal canal stenosis. Severe left and
moderate right neural foraminal narrowing.

C6-C7: Disc height loss with disc osteophyte complex. Uncovertebral
and facet arthropathy. No spinal canal stenosis. Severe left and
moderate right neural foraminal narrowing.

C7-T1: No significant disc bulge. No spinal canal stenosis or
neuroforaminal narrowing.

MRI THORACIC SPINE FINDINGS

Alignment:  Physiologic.

Vertebrae: Diffusely decreased marrow signal. No acute fracture or
suspicious osseous lesion.

Cord: Previously noted extensive anterior epidural collection seen
throughout the thoracic spine is no longer seen. Cord is normal in
signal and morphology.

Paraspinal and other soft tissues: Previously noted extensive
consolidation in the right lung is no longer seen.

Disc levels:

No spinal canal stenosis or significant neural foraminal narrowing.

MRI LUMBAR SPINE FINDINGS

Segmentation:  Standard.

Alignment:  No listhesis.

Vertebrae: Diffusely decreased marrow signal. Increased T1 and T2
signal at L5-S1 and, to a lesser extent L4-L5, likely related to
prior osteomyelitis, with advanced degenerative changes compared to
the [DATE] radiographs. Diffusely decreased marrow signal.
Congenitally short pedicles, which narrow the AP diameter of the
spinal canal.

Conus medullaris and cauda equina: Conus extends to the L1-L2 level.
There is some clumping of the nerve roots at L4 and L5. Conus and
cauda equina appear otherwise normal. Possible small epidural
collection at the posterior aspect of L4 and L5. Previously noted
epidural collection is otherwise resolved.

Paraspinal and other soft tissues: Increased T2 signal in the medial
aspect of the psoas, likely edema. No focal collection is seen.

Disc levels:

T12-L1: No significant disc bulge. No spinal canal stenosis or
neural foraminal narrowing.

L1-L2: No significant disc bulge. Mild facet arthropathy. Mild
spinal canal stenosis. No neural foraminal narrowing.

L2-L3: No significant disc bulge. Mild facet arthropathy. No spinal
canal stenosis or neural foraminal narrowing.

L3-L4: No significant disc bulge. Mild facet arthropathy. No spinal
canal stenosis. Mild right and moderate left neural foraminal
narrowing.

L4-L5: Epidural collection along the posterior aspect of L4 measures
up to 7 mm. Disc height loss and disc osteophyte complex.
Mild-to-moderate facet arthropathy. No spinal canal stenosis.
Moderate to severe left and moderate right neural foraminal
narrowing.

L5-S1: Disc height loss and disc osteophyte complex. Ventral
collection measures up to 5 mm (series 19, image 33). No spinal
canal stenosis. Moderate bilateral neural foraminal narrowing.
IMPRESSION: 1. Significant interval improvement in the size of a ventral
epidural collection, now only seen posterior to the L4 and L5
vertebral bodies. Although lumbar levels were not imaged previously,
there is evidence of discitis osteomyelitis at L4-L5 and L5-S1, with
endplate irregularity, as well as degenerative changes that have
advanced compared to the prior radiograph and edema in the adjacent
musculature. In addition there is clumping of the nerve roots at L4
and L5, concerning for arachnoiditis.
2. Marrow edema C5-C7 appear similar to the prior exam. Edema in the
cervical paraspinous muscles has improved.
3. Multilevel neural foraminal narrowing in the cervical spine and
lumbar spine, with mild spinal canal stenosis at L2-L3.
4. Diffusely decreased marrow signal, which is nonspecific; although
this can be caused by an infiltrative marrow process, the most
common causes include anemia, obesity, or tobacco use.

## 2021-12-23 LAB — SODIUM: Sodium: 134 mmol/L — ABNORMAL LOW (ref 135–145)

## 2021-12-23 LAB — MAGNESIUM: Magnesium: 1.8 mg/dL (ref 1.7–2.4)

## 2021-12-23 LAB — POTASSIUM: Potassium: 5 mmol/L (ref 3.5–5.1)

## 2022-01-02 MED FILL — Medication: Qty: 1 | Status: AC
